# Patient Record
Sex: Female | Born: 1945 | Race: White | Hispanic: No | Marital: Married | Smoking: Never smoker
Health system: Southern US, Community
[De-identification: ages and names within clinical notes are randomized; demographics above are authoritative.]

## PROBLEM LIST (undated history)

## (undated) DIAGNOSIS — K219 Gastro-esophageal reflux disease without esophagitis: Secondary | ICD-10-CM

## (undated) DIAGNOSIS — M1990 Unspecified osteoarthritis, unspecified site: Secondary | ICD-10-CM

## (undated) DIAGNOSIS — I10 Essential (primary) hypertension: Secondary | ICD-10-CM

## (undated) DIAGNOSIS — C4490 Unspecified malignant neoplasm of skin, unspecified: Secondary | ICD-10-CM

## (undated) HISTORY — PX: TOTAL ABDOMINAL HYSTERECTOMY: SHX209

---

## 2001-03-06 ENCOUNTER — Other Ambulatory Visit: Admission: RE | Admit: 2001-03-06 | Discharge: 2001-03-06 | Payer: Self-pay | Admitting: Obstetrics and Gynecology

## 2002-03-29 ENCOUNTER — Other Ambulatory Visit: Admission: RE | Admit: 2002-03-29 | Discharge: 2002-03-29 | Payer: Self-pay | Admitting: Obstetrics and Gynecology

## 2003-10-10 ENCOUNTER — Other Ambulatory Visit: Admission: RE | Admit: 2003-10-10 | Discharge: 2003-10-10 | Payer: Self-pay | Admitting: Obstetrics and Gynecology

## 2004-03-19 ENCOUNTER — Ambulatory Visit (HOSPITAL_COMMUNITY): Admission: RE | Admit: 2004-03-19 | Discharge: 2004-03-19 | Payer: Self-pay | Admitting: Gastroenterology

## 2004-11-17 ENCOUNTER — Other Ambulatory Visit: Admission: RE | Admit: 2004-11-17 | Discharge: 2004-11-17 | Payer: Self-pay | Admitting: Obstetrics and Gynecology

## 2011-09-15 ENCOUNTER — Other Ambulatory Visit: Payer: Self-pay | Admitting: Gastroenterology

## 2012-03-12 ENCOUNTER — Other Ambulatory Visit: Payer: Self-pay | Admitting: Obstetrics and Gynecology

## 2013-06-12 DIAGNOSIS — Z0000 Encounter for general adult medical examination without abnormal findings: Secondary | ICD-10-CM | POA: Diagnosis not present

## 2013-06-12 DIAGNOSIS — Z1331 Encounter for screening for depression: Secondary | ICD-10-CM | POA: Diagnosis not present

## 2013-06-12 DIAGNOSIS — K219 Gastro-esophageal reflux disease without esophagitis: Secondary | ICD-10-CM | POA: Diagnosis not present

## 2013-06-12 DIAGNOSIS — Z23 Encounter for immunization: Secondary | ICD-10-CM | POA: Diagnosis not present

## 2013-06-12 DIAGNOSIS — R7301 Impaired fasting glucose: Secondary | ICD-10-CM | POA: Diagnosis not present

## 2013-06-12 DIAGNOSIS — E7800 Pure hypercholesterolemia, unspecified: Secondary | ICD-10-CM | POA: Diagnosis not present

## 2013-06-12 DIAGNOSIS — Z86010 Personal history of colonic polyps: Secondary | ICD-10-CM | POA: Diagnosis not present

## 2013-06-12 DIAGNOSIS — I10 Essential (primary) hypertension: Secondary | ICD-10-CM | POA: Diagnosis not present

## 2013-07-08 DIAGNOSIS — Z01419 Encounter for gynecological examination (general) (routine) without abnormal findings: Secondary | ICD-10-CM | POA: Diagnosis not present

## 2013-07-08 DIAGNOSIS — N959 Unspecified menopausal and perimenopausal disorder: Secondary | ICD-10-CM | POA: Diagnosis not present

## 2013-07-08 DIAGNOSIS — Z1231 Encounter for screening mammogram for malignant neoplasm of breast: Secondary | ICD-10-CM | POA: Diagnosis not present

## 2013-11-08 ENCOUNTER — Other Ambulatory Visit: Payer: Self-pay | Admitting: Gastroenterology

## 2013-11-08 DIAGNOSIS — Z86010 Personal history of colonic polyps: Secondary | ICD-10-CM | POA: Diagnosis not present

## 2013-11-08 DIAGNOSIS — Z09 Encounter for follow-up examination after completed treatment for conditions other than malignant neoplasm: Secondary | ICD-10-CM | POA: Diagnosis not present

## 2013-11-08 DIAGNOSIS — D126 Benign neoplasm of colon, unspecified: Secondary | ICD-10-CM | POA: Diagnosis not present

## 2014-03-19 DIAGNOSIS — H2513 Age-related nuclear cataract, bilateral: Secondary | ICD-10-CM | POA: Diagnosis not present

## 2014-06-02 DIAGNOSIS — C44519 Basal cell carcinoma of skin of other part of trunk: Secondary | ICD-10-CM | POA: Diagnosis not present

## 2014-06-02 DIAGNOSIS — D485 Neoplasm of uncertain behavior of skin: Secondary | ICD-10-CM | POA: Diagnosis not present

## 2014-06-02 DIAGNOSIS — L408 Other psoriasis: Secondary | ICD-10-CM | POA: Diagnosis not present

## 2014-07-17 DIAGNOSIS — R7301 Impaired fasting glucose: Secondary | ICD-10-CM | POA: Diagnosis not present

## 2014-07-17 DIAGNOSIS — I10 Essential (primary) hypertension: Secondary | ICD-10-CM | POA: Diagnosis not present

## 2014-07-17 DIAGNOSIS — C4491 Basal cell carcinoma of skin, unspecified: Secondary | ICD-10-CM | POA: Diagnosis not present

## 2014-07-17 DIAGNOSIS — Z86010 Personal history of colonic polyps: Secondary | ICD-10-CM | POA: Diagnosis not present

## 2014-07-17 DIAGNOSIS — Z23 Encounter for immunization: Secondary | ICD-10-CM | POA: Diagnosis not present

## 2014-07-17 DIAGNOSIS — Z0000 Encounter for general adult medical examination without abnormal findings: Secondary | ICD-10-CM | POA: Diagnosis not present

## 2014-07-17 DIAGNOSIS — E785 Hyperlipidemia, unspecified: Secondary | ICD-10-CM | POA: Diagnosis not present

## 2014-07-17 DIAGNOSIS — R8290 Unspecified abnormal findings in urine: Secondary | ICD-10-CM | POA: Diagnosis not present

## 2014-08-04 DIAGNOSIS — L570 Actinic keratosis: Secondary | ICD-10-CM | POA: Diagnosis not present

## 2014-08-04 DIAGNOSIS — L408 Other psoriasis: Secondary | ICD-10-CM | POA: Diagnosis not present

## 2014-09-10 ENCOUNTER — Other Ambulatory Visit: Payer: Self-pay | Admitting: Obstetrics and Gynecology

## 2014-09-10 DIAGNOSIS — Z1231 Encounter for screening mammogram for malignant neoplasm of breast: Secondary | ICD-10-CM | POA: Diagnosis not present

## 2014-09-10 DIAGNOSIS — Z124 Encounter for screening for malignant neoplasm of cervix: Secondary | ICD-10-CM | POA: Diagnosis not present

## 2014-09-10 DIAGNOSIS — Z1272 Encounter for screening for malignant neoplasm of vagina: Secondary | ICD-10-CM | POA: Diagnosis not present

## 2014-09-10 DIAGNOSIS — Z90710 Acquired absence of both cervix and uterus: Secondary | ICD-10-CM | POA: Diagnosis not present

## 2014-09-10 DIAGNOSIS — Z6836 Body mass index (BMI) 36.0-36.9, adult: Secondary | ICD-10-CM | POA: Diagnosis not present

## 2014-09-11 LAB — CYTOLOGY - PAP

## 2015-01-14 DIAGNOSIS — H1011 Acute atopic conjunctivitis, right eye: Secondary | ICD-10-CM | POA: Diagnosis not present

## 2015-04-08 DIAGNOSIS — D2239 Melanocytic nevi of other parts of face: Secondary | ICD-10-CM | POA: Diagnosis not present

## 2015-04-08 DIAGNOSIS — L408 Other psoriasis: Secondary | ICD-10-CM | POA: Diagnosis not present

## 2015-04-08 DIAGNOSIS — N760 Acute vaginitis: Secondary | ICD-10-CM | POA: Diagnosis not present

## 2015-04-08 DIAGNOSIS — Z85828 Personal history of other malignant neoplasm of skin: Secondary | ICD-10-CM | POA: Diagnosis not present

## 2015-04-08 DIAGNOSIS — Z08 Encounter for follow-up examination after completed treatment for malignant neoplasm: Secondary | ICD-10-CM | POA: Diagnosis not present

## 2015-04-08 DIAGNOSIS — L820 Inflamed seborrheic keratosis: Secondary | ICD-10-CM | POA: Diagnosis not present

## 2015-06-22 DIAGNOSIS — H35363 Drusen (degenerative) of macula, bilateral: Secondary | ICD-10-CM | POA: Diagnosis not present

## 2015-06-22 DIAGNOSIS — H5203 Hypermetropia, bilateral: Secondary | ICD-10-CM | POA: Diagnosis not present

## 2015-06-22 DIAGNOSIS — Z135 Encounter for screening for eye and ear disorders: Secondary | ICD-10-CM | POA: Diagnosis not present

## 2015-06-22 DIAGNOSIS — H04123 Dry eye syndrome of bilateral lacrimal glands: Secondary | ICD-10-CM | POA: Diagnosis not present

## 2015-06-22 DIAGNOSIS — H2513 Age-related nuclear cataract, bilateral: Secondary | ICD-10-CM | POA: Diagnosis not present

## 2015-08-05 DIAGNOSIS — E785 Hyperlipidemia, unspecified: Secondary | ICD-10-CM | POA: Diagnosis not present

## 2015-08-05 DIAGNOSIS — Z86010 Personal history of colonic polyps: Secondary | ICD-10-CM | POA: Diagnosis not present

## 2015-08-05 DIAGNOSIS — Z0000 Encounter for general adult medical examination without abnormal findings: Secondary | ICD-10-CM | POA: Diagnosis not present

## 2015-08-05 DIAGNOSIS — R7301 Impaired fasting glucose: Secondary | ICD-10-CM | POA: Diagnosis not present

## 2015-08-05 DIAGNOSIS — I10 Essential (primary) hypertension: Secondary | ICD-10-CM | POA: Diagnosis not present

## 2015-08-05 DIAGNOSIS — L309 Dermatitis, unspecified: Secondary | ICD-10-CM | POA: Diagnosis not present

## 2015-09-21 DIAGNOSIS — B078 Other viral warts: Secondary | ICD-10-CM | POA: Diagnosis not present

## 2015-09-21 DIAGNOSIS — D2371 Other benign neoplasm of skin of right lower limb, including hip: Secondary | ICD-10-CM | POA: Diagnosis not present

## 2015-09-21 DIAGNOSIS — L821 Other seborrheic keratosis: Secondary | ICD-10-CM | POA: Diagnosis not present

## 2015-09-21 DIAGNOSIS — D485 Neoplasm of uncertain behavior of skin: Secondary | ICD-10-CM | POA: Diagnosis not present

## 2015-11-09 DIAGNOSIS — L700 Acne vulgaris: Secondary | ICD-10-CM | POA: Diagnosis not present

## 2015-11-09 DIAGNOSIS — L2084 Intrinsic (allergic) eczema: Secondary | ICD-10-CM | POA: Diagnosis not present

## 2015-11-11 DIAGNOSIS — N958 Other specified menopausal and perimenopausal disorders: Secondary | ICD-10-CM | POA: Diagnosis not present

## 2015-11-11 DIAGNOSIS — Z1231 Encounter for screening mammogram for malignant neoplasm of breast: Secondary | ICD-10-CM | POA: Diagnosis not present

## 2015-11-18 ENCOUNTER — Other Ambulatory Visit: Payer: Self-pay | Admitting: Obstetrics and Gynecology

## 2015-11-18 DIAGNOSIS — R928 Other abnormal and inconclusive findings on diagnostic imaging of breast: Secondary | ICD-10-CM

## 2015-11-20 ENCOUNTER — Ambulatory Visit
Admission: RE | Admit: 2015-11-20 | Discharge: 2015-11-20 | Disposition: A | Discharge status: Home / Self Care | Payer: Medicare Other | Source: Ambulatory Visit | Attending: Obstetrics and Gynecology | Admitting: Obstetrics and Gynecology

## 2015-11-20 ENCOUNTER — Other Ambulatory Visit: Payer: Self-pay | Admitting: Obstetrics and Gynecology

## 2015-11-20 DIAGNOSIS — R928 Other abnormal and inconclusive findings on diagnostic imaging of breast: Secondary | ICD-10-CM

## 2015-11-20 DIAGNOSIS — N63 Unspecified lump in breast: Secondary | ICD-10-CM | POA: Diagnosis not present

## 2016-01-05 DIAGNOSIS — Z6834 Body mass index (BMI) 34.0-34.9, adult: Secondary | ICD-10-CM | POA: Diagnosis not present

## 2016-01-05 DIAGNOSIS — Z01419 Encounter for gynecological examination (general) (routine) without abnormal findings: Secondary | ICD-10-CM | POA: Diagnosis not present

## 2016-03-10 DIAGNOSIS — H8110 Benign paroxysmal vertigo, unspecified ear: Secondary | ICD-10-CM | POA: Diagnosis not present

## 2016-05-09 DIAGNOSIS — D485 Neoplasm of uncertain behavior of skin: Secondary | ICD-10-CM | POA: Diagnosis not present

## 2016-05-09 DIAGNOSIS — L821 Other seborrheic keratosis: Secondary | ICD-10-CM | POA: Diagnosis not present

## 2016-05-09 DIAGNOSIS — L820 Inflamed seborrheic keratosis: Secondary | ICD-10-CM | POA: Diagnosis not present

## 2016-05-09 DIAGNOSIS — D225 Melanocytic nevi of trunk: Secondary | ICD-10-CM | POA: Diagnosis not present

## 2016-06-28 ENCOUNTER — Other Ambulatory Visit: Payer: Self-pay | Admitting: Obstetrics and Gynecology

## 2016-06-28 DIAGNOSIS — N6489 Other specified disorders of breast: Secondary | ICD-10-CM

## 2016-06-28 DIAGNOSIS — N63 Unspecified lump in unspecified breast: Secondary | ICD-10-CM

## 2016-07-12 ENCOUNTER — Other Ambulatory Visit: Payer: Self-pay | Admitting: Obstetrics and Gynecology

## 2016-07-12 ENCOUNTER — Ambulatory Visit
Admission: RE | Admit: 2016-07-12 | Discharge: 2016-07-12 | Disposition: A | Discharge status: Home / Self Care | Payer: Medicare Other | Source: Ambulatory Visit | Attending: Obstetrics and Gynecology | Admitting: Obstetrics and Gynecology

## 2016-07-12 DIAGNOSIS — N6012 Diffuse cystic mastopathy of left breast: Secondary | ICD-10-CM | POA: Diagnosis not present

## 2016-07-12 DIAGNOSIS — H43811 Vitreous degeneration, right eye: Secondary | ICD-10-CM | POA: Diagnosis not present

## 2016-07-12 DIAGNOSIS — R928 Other abnormal and inconclusive findings on diagnostic imaging of breast: Secondary | ICD-10-CM | POA: Diagnosis not present

## 2016-07-12 DIAGNOSIS — N6489 Other specified disorders of breast: Secondary | ICD-10-CM

## 2016-07-12 DIAGNOSIS — H2513 Age-related nuclear cataract, bilateral: Secondary | ICD-10-CM | POA: Diagnosis not present

## 2016-07-12 DIAGNOSIS — H35363 Drusen (degenerative) of macula, bilateral: Secondary | ICD-10-CM | POA: Diagnosis not present

## 2016-07-12 HISTORY — DX: Unspecified malignant neoplasm of skin, unspecified: C44.90

## 2016-07-14 ENCOUNTER — Other Ambulatory Visit: Payer: Self-pay | Admitting: Obstetrics and Gynecology

## 2016-07-14 DIAGNOSIS — N63 Unspecified lump in unspecified breast: Secondary | ICD-10-CM

## 2016-08-03 DIAGNOSIS — H43811 Vitreous degeneration, right eye: Secondary | ICD-10-CM | POA: Diagnosis not present

## 2016-08-03 DIAGNOSIS — H35363 Drusen (degenerative) of macula, bilateral: Secondary | ICD-10-CM | POA: Diagnosis not present

## 2016-08-03 DIAGNOSIS — H2513 Age-related nuclear cataract, bilateral: Secondary | ICD-10-CM | POA: Diagnosis not present

## 2016-08-17 DIAGNOSIS — E785 Hyperlipidemia, unspecified: Secondary | ICD-10-CM | POA: Diagnosis not present

## 2016-08-17 DIAGNOSIS — M899 Disorder of bone, unspecified: Secondary | ICD-10-CM | POA: Diagnosis not present

## 2016-08-17 DIAGNOSIS — Z0000 Encounter for general adult medical examination without abnormal findings: Secondary | ICD-10-CM | POA: Diagnosis not present

## 2016-08-17 DIAGNOSIS — I10 Essential (primary) hypertension: Secondary | ICD-10-CM | POA: Diagnosis not present

## 2016-08-17 DIAGNOSIS — R7301 Impaired fasting glucose: Secondary | ICD-10-CM | POA: Diagnosis not present

## 2016-08-17 DIAGNOSIS — M85859 Other specified disorders of bone density and structure, unspecified thigh: Secondary | ICD-10-CM | POA: Diagnosis not present

## 2016-08-17 DIAGNOSIS — L309 Dermatitis, unspecified: Secondary | ICD-10-CM | POA: Diagnosis not present

## 2016-08-17 DIAGNOSIS — Z86010 Personal history of colonic polyps: Secondary | ICD-10-CM | POA: Diagnosis not present

## 2016-09-14 DIAGNOSIS — H5202 Hypermetropia, left eye: Secondary | ICD-10-CM | POA: Diagnosis not present

## 2016-09-14 DIAGNOSIS — H35363 Drusen (degenerative) of macula, bilateral: Secondary | ICD-10-CM | POA: Diagnosis not present

## 2016-11-24 ENCOUNTER — Other Ambulatory Visit: Payer: Medicare Other

## 2016-11-30 DIAGNOSIS — L570 Actinic keratosis: Secondary | ICD-10-CM | POA: Diagnosis not present

## 2016-11-30 DIAGNOSIS — Z85828 Personal history of other malignant neoplasm of skin: Secondary | ICD-10-CM | POA: Diagnosis not present

## 2016-11-30 DIAGNOSIS — D225 Melanocytic nevi of trunk: Secondary | ICD-10-CM | POA: Diagnosis not present

## 2016-11-30 DIAGNOSIS — Z08 Encounter for follow-up examination after completed treatment for malignant neoplasm: Secondary | ICD-10-CM | POA: Diagnosis not present

## 2017-01-12 ENCOUNTER — Ambulatory Visit
Admission: RE | Admit: 2017-01-12 | Discharge: 2017-01-12 | Disposition: A | Discharge status: Home / Self Care | Payer: Medicare Other | Source: Ambulatory Visit | Attending: Obstetrics and Gynecology | Admitting: Obstetrics and Gynecology

## 2017-01-12 DIAGNOSIS — Z6836 Body mass index (BMI) 36.0-36.9, adult: Secondary | ICD-10-CM | POA: Diagnosis not present

## 2017-01-12 DIAGNOSIS — Z90710 Acquired absence of both cervix and uterus: Secondary | ICD-10-CM | POA: Diagnosis not present

## 2017-01-12 DIAGNOSIS — R928 Other abnormal and inconclusive findings on diagnostic imaging of breast: Secondary | ICD-10-CM | POA: Diagnosis not present

## 2017-01-12 DIAGNOSIS — Z1272 Encounter for screening for malignant neoplasm of vagina: Secondary | ICD-10-CM | POA: Diagnosis not present

## 2017-01-12 DIAGNOSIS — N6489 Other specified disorders of breast: Secondary | ICD-10-CM | POA: Diagnosis not present

## 2017-01-12 DIAGNOSIS — N63 Unspecified lump in unspecified breast: Secondary | ICD-10-CM

## 2017-01-12 DIAGNOSIS — Z124 Encounter for screening for malignant neoplasm of cervix: Secondary | ICD-10-CM | POA: Diagnosis not present

## 2017-01-26 DIAGNOSIS — M25512 Pain in left shoulder: Secondary | ICD-10-CM | POA: Diagnosis not present

## 2017-01-26 DIAGNOSIS — M25571 Pain in right ankle and joints of right foot: Secondary | ICD-10-CM | POA: Diagnosis not present

## 2017-01-31 DIAGNOSIS — M25571 Pain in right ankle and joints of right foot: Secondary | ICD-10-CM | POA: Diagnosis not present

## 2017-01-31 DIAGNOSIS — M7661 Achilles tendinitis, right leg: Secondary | ICD-10-CM | POA: Diagnosis not present

## 2017-02-10 DIAGNOSIS — M25571 Pain in right ankle and joints of right foot: Secondary | ICD-10-CM | POA: Diagnosis not present

## 2017-02-10 DIAGNOSIS — M7661 Achilles tendinitis, right leg: Secondary | ICD-10-CM | POA: Diagnosis not present

## 2017-02-14 DIAGNOSIS — M25571 Pain in right ankle and joints of right foot: Secondary | ICD-10-CM | POA: Diagnosis not present

## 2017-02-14 DIAGNOSIS — M7661 Achilles tendinitis, right leg: Secondary | ICD-10-CM | POA: Diagnosis not present

## 2017-02-16 DIAGNOSIS — M25571 Pain in right ankle and joints of right foot: Secondary | ICD-10-CM | POA: Diagnosis not present

## 2017-02-23 DIAGNOSIS — M25571 Pain in right ankle and joints of right foot: Secondary | ICD-10-CM | POA: Diagnosis not present

## 2017-03-07 DIAGNOSIS — M25571 Pain in right ankle and joints of right foot: Secondary | ICD-10-CM | POA: Diagnosis not present

## 2017-09-27 DIAGNOSIS — Z6835 Body mass index (BMI) 35.0-35.9, adult: Secondary | ICD-10-CM | POA: Diagnosis not present

## 2017-09-27 DIAGNOSIS — Z0000 Encounter for general adult medical examination without abnormal findings: Secondary | ICD-10-CM | POA: Diagnosis not present

## 2017-09-27 DIAGNOSIS — L309 Dermatitis, unspecified: Secondary | ICD-10-CM | POA: Diagnosis not present

## 2017-09-27 DIAGNOSIS — I10 Essential (primary) hypertension: Secondary | ICD-10-CM | POA: Diagnosis not present

## 2017-09-27 DIAGNOSIS — R7301 Impaired fasting glucose: Secondary | ICD-10-CM | POA: Diagnosis not present

## 2017-09-27 DIAGNOSIS — E785 Hyperlipidemia, unspecified: Secondary | ICD-10-CM | POA: Diagnosis not present

## 2017-09-27 DIAGNOSIS — Z86010 Personal history of colonic polyps: Secondary | ICD-10-CM | POA: Diagnosis not present

## 2017-12-21 ENCOUNTER — Other Ambulatory Visit: Payer: Self-pay | Admitting: Obstetrics and Gynecology

## 2017-12-21 DIAGNOSIS — N6001 Solitary cyst of right breast: Secondary | ICD-10-CM

## 2018-01-08 DIAGNOSIS — H5202 Hypermetropia, left eye: Secondary | ICD-10-CM | POA: Diagnosis not present

## 2018-01-08 DIAGNOSIS — H43811 Vitreous degeneration, right eye: Secondary | ICD-10-CM | POA: Diagnosis not present

## 2018-01-08 DIAGNOSIS — Z135 Encounter for screening for eye and ear disorders: Secondary | ICD-10-CM | POA: Diagnosis not present

## 2018-01-08 DIAGNOSIS — H35363 Drusen (degenerative) of macula, bilateral: Secondary | ICD-10-CM | POA: Diagnosis not present

## 2018-01-08 DIAGNOSIS — H2513 Age-related nuclear cataract, bilateral: Secondary | ICD-10-CM | POA: Diagnosis not present

## 2018-01-15 ENCOUNTER — Ambulatory Visit
Admission: RE | Admit: 2018-01-15 | Discharge: 2018-01-15 | Disposition: A | Discharge status: Home / Self Care | Payer: Medicare Other | Source: Ambulatory Visit | Attending: Obstetrics and Gynecology | Admitting: Obstetrics and Gynecology

## 2018-01-15 DIAGNOSIS — N6011 Diffuse cystic mastopathy of right breast: Secondary | ICD-10-CM | POA: Diagnosis not present

## 2018-01-15 DIAGNOSIS — N6001 Solitary cyst of right breast: Secondary | ICD-10-CM

## 2018-01-15 DIAGNOSIS — R928 Other abnormal and inconclusive findings on diagnostic imaging of breast: Secondary | ICD-10-CM | POA: Diagnosis not present

## 2018-01-17 DIAGNOSIS — N958 Other specified menopausal and perimenopausal disorders: Secondary | ICD-10-CM | POA: Diagnosis not present

## 2018-01-17 DIAGNOSIS — Z6837 Body mass index (BMI) 37.0-37.9, adult: Secondary | ICD-10-CM | POA: Diagnosis not present

## 2018-01-17 DIAGNOSIS — Z01419 Encounter for gynecological examination (general) (routine) without abnormal findings: Secondary | ICD-10-CM | POA: Diagnosis not present

## 2018-01-23 DIAGNOSIS — H10239 Serous conjunctivitis, except viral, unspecified eye: Secondary | ICD-10-CM | POA: Diagnosis not present

## 2018-03-09 DIAGNOSIS — B029 Zoster without complications: Secondary | ICD-10-CM | POA: Diagnosis not present

## 2018-03-20 DIAGNOSIS — C44311 Basal cell carcinoma of skin of nose: Secondary | ICD-10-CM | POA: Diagnosis not present

## 2018-03-20 DIAGNOSIS — R208 Other disturbances of skin sensation: Secondary | ICD-10-CM | POA: Diagnosis not present

## 2018-03-20 DIAGNOSIS — L814 Other melanin hyperpigmentation: Secondary | ICD-10-CM | POA: Diagnosis not present

## 2018-03-20 DIAGNOSIS — L821 Other seborrheic keratosis: Secondary | ICD-10-CM | POA: Diagnosis not present

## 2018-03-20 DIAGNOSIS — C44519 Basal cell carcinoma of skin of other part of trunk: Secondary | ICD-10-CM | POA: Diagnosis not present

## 2018-03-20 DIAGNOSIS — C44319 Basal cell carcinoma of skin of other parts of face: Secondary | ICD-10-CM | POA: Diagnosis not present

## 2018-03-20 DIAGNOSIS — L218 Other seborrheic dermatitis: Secondary | ICD-10-CM | POA: Diagnosis not present

## 2018-03-20 DIAGNOSIS — L308 Other specified dermatitis: Secondary | ICD-10-CM | POA: Diagnosis not present

## 2018-03-20 DIAGNOSIS — D2261 Melanocytic nevi of right upper limb, including shoulder: Secondary | ICD-10-CM | POA: Diagnosis not present

## 2018-03-20 DIAGNOSIS — D2262 Melanocytic nevi of left upper limb, including shoulder: Secondary | ICD-10-CM | POA: Diagnosis not present

## 2018-03-20 DIAGNOSIS — D2271 Melanocytic nevi of right lower limb, including hip: Secondary | ICD-10-CM | POA: Diagnosis not present

## 2018-03-20 DIAGNOSIS — Z85828 Personal history of other malignant neoplasm of skin: Secondary | ICD-10-CM | POA: Diagnosis not present

## 2018-03-20 DIAGNOSIS — L570 Actinic keratosis: Secondary | ICD-10-CM | POA: Diagnosis not present

## 2018-04-17 DIAGNOSIS — Z85828 Personal history of other malignant neoplasm of skin: Secondary | ICD-10-CM | POA: Diagnosis not present

## 2018-04-17 DIAGNOSIS — C44311 Basal cell carcinoma of skin of nose: Secondary | ICD-10-CM | POA: Diagnosis not present

## 2018-05-03 DIAGNOSIS — M5416 Radiculopathy, lumbar region: Secondary | ICD-10-CM | POA: Diagnosis not present

## 2018-05-03 DIAGNOSIS — Z6837 Body mass index (BMI) 37.0-37.9, adult: Secondary | ICD-10-CM | POA: Diagnosis not present

## 2018-05-18 ENCOUNTER — Other Ambulatory Visit: Payer: Self-pay | Admitting: Family Medicine

## 2018-05-18 DIAGNOSIS — M5416 Radiculopathy, lumbar region: Secondary | ICD-10-CM

## 2018-05-22 ENCOUNTER — Other Ambulatory Visit: Payer: Medicare Other

## 2018-05-25 ENCOUNTER — Other Ambulatory Visit: Payer: Medicare Other

## 2018-07-16 ENCOUNTER — Other Ambulatory Visit: Payer: Medicare Other

## 2018-07-30 ENCOUNTER — Ambulatory Visit
Admission: RE | Admit: 2018-07-30 | Discharge: 2018-07-30 | Disposition: A | Discharge status: Home / Self Care | Payer: Medicare Other | Source: Ambulatory Visit | Attending: Family Medicine | Admitting: Family Medicine

## 2018-07-30 ENCOUNTER — Other Ambulatory Visit: Payer: Self-pay

## 2018-07-30 DIAGNOSIS — M48061 Spinal stenosis, lumbar region without neurogenic claudication: Secondary | ICD-10-CM | POA: Diagnosis not present

## 2018-07-30 DIAGNOSIS — M5416 Radiculopathy, lumbar region: Secondary | ICD-10-CM

## 2018-08-17 DIAGNOSIS — M5416 Radiculopathy, lumbar region: Secondary | ICD-10-CM | POA: Diagnosis not present

## 2018-08-17 DIAGNOSIS — R200 Anesthesia of skin: Secondary | ICD-10-CM | POA: Diagnosis not present

## 2018-08-17 DIAGNOSIS — Z6836 Body mass index (BMI) 36.0-36.9, adult: Secondary | ICD-10-CM | POA: Diagnosis not present

## 2018-08-17 DIAGNOSIS — I10 Essential (primary) hypertension: Secondary | ICD-10-CM | POA: Diagnosis not present

## 2018-08-24 ENCOUNTER — Other Ambulatory Visit: Payer: Self-pay | Admitting: Neurosurgery

## 2018-08-24 DIAGNOSIS — M5416 Radiculopathy, lumbar region: Secondary | ICD-10-CM

## 2018-09-17 ENCOUNTER — Ambulatory Visit
Admission: RE | Admit: 2018-09-17 | Discharge: 2018-09-17 | Disposition: A | Discharge status: Home / Self Care | Payer: Medicare Other | Source: Ambulatory Visit | Attending: Neurosurgery | Admitting: Neurosurgery

## 2018-09-17 DIAGNOSIS — M5416 Radiculopathy, lumbar region: Secondary | ICD-10-CM

## 2018-09-17 DIAGNOSIS — M48061 Spinal stenosis, lumbar region without neurogenic claudication: Secondary | ICD-10-CM | POA: Diagnosis not present

## 2018-09-17 MED ORDER — IOPAMIDOL (ISOVUE-M 200) INJECTION 41%
20.0000 mL | Freq: Once | INTRAMUSCULAR | Status: AC
  Administered 2018-09-17: 12:00:00 20 mL via INTRATHECAL

## 2018-09-17 MED ORDER — DIAZEPAM 5 MG PO TABS
5.0000 mg | ORAL_TABLET | Freq: Once | ORAL | Status: AC
  Administered 2018-09-17: 10:00:00 5 mg via ORAL

## 2018-09-17 NOTE — Discharge Instructions (Signed)

## 2018-10-05 DIAGNOSIS — I10 Essential (primary) hypertension: Secondary | ICD-10-CM | POA: Diagnosis not present

## 2018-10-05 DIAGNOSIS — M48062 Spinal stenosis, lumbar region with neurogenic claudication: Secondary | ICD-10-CM | POA: Diagnosis not present

## 2018-10-05 DIAGNOSIS — Z6836 Body mass index (BMI) 36.0-36.9, adult: Secondary | ICD-10-CM | POA: Diagnosis not present

## 2018-10-31 DIAGNOSIS — I10 Essential (primary) hypertension: Secondary | ICD-10-CM | POA: Diagnosis not present

## 2018-10-31 DIAGNOSIS — Z23 Encounter for immunization: Secondary | ICD-10-CM | POA: Diagnosis not present

## 2018-10-31 DIAGNOSIS — S81812A Laceration without foreign body, left lower leg, initial encounter: Secondary | ICD-10-CM | POA: Diagnosis not present

## 2018-10-31 DIAGNOSIS — Z1159 Encounter for screening for other viral diseases: Secondary | ICD-10-CM | POA: Diagnosis not present

## 2018-10-31 DIAGNOSIS — Z86010 Personal history of colonic polyps: Secondary | ICD-10-CM | POA: Diagnosis not present

## 2018-10-31 DIAGNOSIS — M48062 Spinal stenosis, lumbar region with neurogenic claudication: Secondary | ICD-10-CM | POA: Diagnosis not present

## 2018-10-31 DIAGNOSIS — Z0000 Encounter for general adult medical examination without abnormal findings: Secondary | ICD-10-CM | POA: Diagnosis not present

## 2018-10-31 DIAGNOSIS — R7301 Impaired fasting glucose: Secondary | ICD-10-CM | POA: Diagnosis not present

## 2018-10-31 DIAGNOSIS — M85859 Other specified disorders of bone density and structure, unspecified thigh: Secondary | ICD-10-CM | POA: Diagnosis not present

## 2018-10-31 DIAGNOSIS — E785 Hyperlipidemia, unspecified: Secondary | ICD-10-CM | POA: Diagnosis not present

## 2018-12-19 DIAGNOSIS — J0110 Acute frontal sinusitis, unspecified: Secondary | ICD-10-CM | POA: Diagnosis not present

## 2019-01-18 DIAGNOSIS — I10 Essential (primary) hypertension: Secondary | ICD-10-CM | POA: Insufficient documentation

## 2019-01-18 DIAGNOSIS — M48062 Spinal stenosis, lumbar region with neurogenic claudication: Secondary | ICD-10-CM | POA: Diagnosis not present

## 2019-01-21 ENCOUNTER — Other Ambulatory Visit: Payer: Self-pay | Admitting: Neurosurgery

## 2019-03-06 NOTE — Progress Notes (Signed)
[id number] - [place], [place] [address] Phone: [phone] Fax: [phone]      Your procedure is scheduled on January 11  Report to [facility] Main Entrance "A" at 0530 A.M., and check in at the Admitting office.  Call this number if you have problems the morning of surgery:  [phone]  Call [phone] if you have any questions prior to your surgery date Monday-Friday 8am-4pm    Remember:  Do not eat or drink after midnight the night before your surgery    Take these medicines the morning of surgery with A SIP OF WATER hydrOXYzine (ATARAX/VISTARIL)   7 days prior to surgery STOP taking any Aspirin (unless otherwise instructed by your surgeon), Aleve, Naproxen, Ibuprofen, Motrin, Advil, Goody's, BC's, all herbal medications, fish oil, and all vitamins.    The Morning of Surgery  Do not wear jewelry, make-up or nail polish.  Do not wear lotions, powders, or perfumes, or deodorant  Do not shave 48 hours prior to surgery.    Do not bring valuables to the hospital.  [facility] is not responsible for any belongings or valuables.  If you are a smoker, DO NOT Smoke 24 hours prior to surgery  If you wear a CPAP at night please bring your mask, tubing, and machine the morning of surgery   Remember that you must have someone to transport you home after your surgery, and remain with you for 24 hours if you are discharged the same day.   Please bring cases for contacts, glasses, hearing aids, dentures or bridgework because it cannot be worn into surgery.    Leave your suitcase in the car.  After surgery it may be brought to your room.  For patients admitted to the hospital, discharge time will be determined by your treatment team.  Patients discharged the day of surgery will not be allowed to drive home.    Special instructions:   [facility]- Preparing For Surgery  Before surgery, you can play an  important role. Because skin is not sterile, your skin needs to be as free of germs as possible. You can reduce the number of germs on your skin by washing with CHG (chlorahexidine gluconate) Soap before surgery.  CHG is an antiseptic cleaner which kills germs and bonds with the skin to continue killing germs even after washing.    Oral Hygiene is also important to reduce your risk of infection.  Remember - BRUSH YOUR TEETH THE MORNING OF SURGERY WITH YOUR REGULAR TOOTHPASTE  Please do not use if you have an allergy to CHG or antibacterial soaps. If your skin becomes reddened/irritated stop using the CHG.  Do not shave (including legs and underarms) for at least 48 hours prior to first CHG shower. It is OK to shave your face.  Please follow these instructions carefully.   1. Shower the NIGHT BEFORE SURGERY and the MORNING OF SURGERY with CHG Soap.   2. If you chose to wash your hair, wash your hair first as usual with your normal shampoo.  3. After you shampoo, rinse your hair and body thoroughly to remove the shampoo.  4. Use CHG as you would any other liquid soap. You can apply CHG directly to the skin and wash gently with a scrungie or a clean washcloth.   5. Apply the CHG Soap to your body ONLY FROM THE NECK DOWN.  Do not use on open wounds or open sores.  Avoid contact with your eyes, ears, mouth and genitals (private parts). Wash Face and genitals (private parts)  with your normal soap.   6. Wash thoroughly, paying special attention to the area where your surgery will be performed.  7. Thoroughly rinse your body with warm water from the neck down.  8. DO NOT shower/wash with your normal soap after using and rinsing off the CHG Soap.  9. Pat yourself dry with a CLEAN TOWEL.  10. Wear CLEAN PAJAMAS to bed the night before surgery, wear comfortable clothes the morning of surgery  11. Place CLEAN SHEETS on your bed the night of your first shower and DO NOT SLEEP WITH PETS.    Day of  Surgery:  Please shower the morning of surgery with the CHG soap Do not apply any deodorants/lotions. Please wear clean clothes to the hospital/surgery center.   Remember to brush your teeth WITH YOUR REGULAR TOOTHPASTE.   Please read over the following fact sheets that you were given.

## 2019-03-07 ENCOUNTER — Other Ambulatory Visit (HOSPITAL_COMMUNITY)
Admission: RE | Admit: 2019-03-07 | Discharge: 2019-03-07 | Disposition: A | Discharge status: Home / Self Care | Payer: Medicare Other | Source: Ambulatory Visit | Attending: Neurosurgery | Admitting: Neurosurgery

## 2019-03-07 ENCOUNTER — Encounter (HOSPITAL_COMMUNITY)
Admission: RE | Admit: 2019-03-07 | Discharge: 2019-03-07 | Disposition: A | Discharge status: Home / Self Care | Payer: Medicare Other | Source: Ambulatory Visit | Attending: Neurosurgery | Admitting: Neurosurgery

## 2019-03-07 ENCOUNTER — Encounter (HOSPITAL_COMMUNITY): Payer: Self-pay

## 2019-03-07 ENCOUNTER — Other Ambulatory Visit: Payer: Self-pay

## 2019-03-07 DIAGNOSIS — Z20822 Contact with and (suspected) exposure to covid-19: Secondary | ICD-10-CM | POA: Diagnosis not present

## 2019-03-07 DIAGNOSIS — Z01818 Encounter for other preprocedural examination: Secondary | ICD-10-CM | POA: Insufficient documentation

## 2019-03-07 HISTORY — DX: Essential (primary) hypertension: I10

## 2019-03-07 HISTORY — DX: Gastro-esophageal reflux disease without esophagitis: K21.9

## 2019-03-07 HISTORY — DX: Unspecified osteoarthritis, unspecified site: M19.90

## 2019-03-07 LAB — BASIC METABOLIC PANEL
Anion gap: 8 (ref 5–15)
BUN: 19 mg/dL (ref 8–23)
CO2: 26 mmol/L (ref 22–32)
Calcium: 9.2 mg/dL (ref 8.9–10.3)
Chloride: 109 mmol/L (ref 98–111)
Creatinine, Ser: 0.87 mg/dL (ref 0.44–1.00)
GFR calc Af Amer: >60 mL/min (ref >60)
GFR calc non Af Amer: >60 mL/min (ref >60)
Glucose, Bld: 126 mg/dL — ABNORMAL HIGH (ref 70–99)
Potassium: 3.9 mmol/L (ref 3.5–5.1)
Sodium: 143 mmol/L (ref 135–145)

## 2019-03-07 LAB — CBC
HCT: 46.1 % — ABNORMAL HIGH (ref 36.0–46.0)
Hemoglobin: 15.4 g/dL — ABNORMAL HIGH (ref 12.0–15.0)
MCH: 29.2 pg (ref 26.0–34.0)
MCHC: 33.4 g/dL (ref 30.0–36.0)
MCV: 87.3 fL (ref 80.0–100.0)
Platelets: 308 10*3/uL (ref 150–400)
RBC: 5.28 MIL/uL — ABNORMAL HIGH (ref 3.87–5.11)
RDW: 12 % (ref 11.5–15.5)
WBC: 5.6 10*3/uL (ref 4.0–10.5)
nRBC: 0 % (ref 0.0–0.2)

## 2019-03-07 LAB — SURGICAL PCR SCREEN
MRSA, PCR: NEGATIVE
Staphylococcus aureus: NEGATIVE

## 2019-03-07 NOTE — Progress Notes (Signed)
PCP - Hulan Fess Cardiologist - denies  Chest x-ray - n/a EKG - 03-07-19   COVID TEST- Thursday (03-07-19)  Possibility that patient's COVID test could come back positive.  Patient's husband tested positive for COVID at the end of October.  Patient stated that she, herself, never had symptoms, therefore she never got tested. She could of potentially had COVID at the same time as her husband.  Advised patient that there is a potential that her surgery could get postponed due to a positive test.      Anesthesia review: n/a  Patient denies shortness of breath, fever, cough and chest pain at PAT appointment   All instructions explained to the patient, with a verbal understanding of the material. Patient agrees to go over the instructions while at home for a better understanding. Patient also instructed to self quarantine after being tested for COVID-19. The opportunity to ask questions was provided.

## 2019-03-08 DIAGNOSIS — H25813 Combined forms of age-related cataract, bilateral: Secondary | ICD-10-CM | POA: Diagnosis not present

## 2019-03-08 DIAGNOSIS — H35363 Drusen (degenerative) of macula, bilateral: Secondary | ICD-10-CM | POA: Diagnosis not present

## 2019-03-08 DIAGNOSIS — H5202 Hypermetropia, left eye: Secondary | ICD-10-CM | POA: Diagnosis not present

## 2019-03-08 DIAGNOSIS — H43813 Vitreous degeneration, bilateral: Secondary | ICD-10-CM | POA: Diagnosis not present

## 2019-03-09 LAB — NOVEL CORONAVIRUS, NAA (HOSP ORDER, SEND-OUT TO REF LAB; TAT 18-24 HRS): SARS-CoV-2, NAA: NOT DETECTED

## 2019-03-10 NOTE — Anesthesia Preprocedure Evaluation (Addendum)
Anesthesia Evaluation  Patient identified by MRN, date of birth, ID band Patient awake    Reviewed: Allergy & Precautions, NPO status , Patient's Chart, lab work & pertinent test results  Airway Mallampati: III  TM Distance: >3 FB Neck ROM: Full    Dental no notable dental hx.    Pulmonary neg pulmonary ROS,    Pulmonary exam normal breath sounds clear to auscultation       Cardiovascular hypertension, Pt. on medications Normal cardiovascular exam Rhythm:Regular Rate:Normal  ECG: SR, rate 76   Neuro/Psych negative neurological ROS  negative psych ROS   GI/Hepatic Neg liver ROS, GERD  Controlled,  Endo/Other  negative endocrine ROS  Renal/GU negative Renal ROS     Musculoskeletal negative musculoskeletal ROS (+)   Abdominal (+) + obese,   Peds  Hematology negative hematology ROS (+)   Anesthesia Other Findings SPINAL STENOSIS, LUMBAR REGION WITH NEUROGENIC CLAUDICATION  Reproductive/Obstetrics                            Anesthesia Physical Anesthesia Plan  ASA: II  Anesthesia Plan: General   Post-op Pain Management:    Induction: Intravenous  PONV Risk Score and Plan: 4 or greater and Ondansetron, Dexamethasone, Midazolam and Treatment may vary due to age or medical condition  Airway Management Planned: Oral ETT  Additional Equipment:   Intra-op Plan:   Post-operative Plan: Extubation in OR  Informed Consent: I have reviewed the patients History and Physical, chart, labs and discussed the procedure including the risks, benefits and alternatives for the proposed anesthesia with the patient or authorized representative who has indicated his/her understanding and acceptance.     Dental advisory given  Plan Discussed with: CRNA  Anesthesia Plan Comments:        Anesthesia Quick Evaluation

## 2019-03-11 ENCOUNTER — Ambulatory Visit (HOSPITAL_COMMUNITY): Payer: Medicare Other

## 2019-03-11 ENCOUNTER — Encounter (HOSPITAL_COMMUNITY)
Admission: RE | Disposition: A | Discharge status: Home / Self Care | Payer: Self-pay | Source: Home / Self Care | Attending: Neurosurgery

## 2019-03-11 ENCOUNTER — Ambulatory Visit (HOSPITAL_COMMUNITY): Payer: Medicare Other | Admitting: Anesthesiology

## 2019-03-11 ENCOUNTER — Observation Stay (HOSPITAL_COMMUNITY)
Admission: RE | Admit: 2019-03-11 | Discharge: 2019-03-12 | Disposition: A | Discharge status: Home / Self Care | Payer: Medicare Other | Attending: Neurosurgery | Admitting: Neurosurgery

## 2019-03-11 ENCOUNTER — Encounter (HOSPITAL_COMMUNITY): Payer: Self-pay | Admitting: Neurosurgery

## 2019-03-11 ENCOUNTER — Other Ambulatory Visit: Payer: Self-pay

## 2019-03-11 DIAGNOSIS — G9741 Accidental puncture or laceration of dura during a procedure: Secondary | ICD-10-CM | POA: Diagnosis not present

## 2019-03-11 DIAGNOSIS — Z419 Encounter for procedure for purposes other than remedying health state, unspecified: Secondary | ICD-10-CM

## 2019-03-11 DIAGNOSIS — Z85828 Personal history of other malignant neoplasm of skin: Secondary | ICD-10-CM | POA: Diagnosis not present

## 2019-03-11 DIAGNOSIS — Z79899 Other long term (current) drug therapy: Secondary | ICD-10-CM | POA: Diagnosis not present

## 2019-03-11 DIAGNOSIS — Z981 Arthrodesis status: Secondary | ICD-10-CM | POA: Diagnosis not present

## 2019-03-11 DIAGNOSIS — M549 Dorsalgia, unspecified: Secondary | ICD-10-CM | POA: Diagnosis present

## 2019-03-11 DIAGNOSIS — M48062 Spinal stenosis, lumbar region with neurogenic claudication: Secondary | ICD-10-CM | POA: Diagnosis not present

## 2019-03-11 DIAGNOSIS — I10 Essential (primary) hypertension: Secondary | ICD-10-CM | POA: Diagnosis not present

## 2019-03-11 DIAGNOSIS — M5416 Radiculopathy, lumbar region: Secondary | ICD-10-CM | POA: Diagnosis not present

## 2019-03-11 DIAGNOSIS — K219 Gastro-esophageal reflux disease without esophagitis: Secondary | ICD-10-CM | POA: Diagnosis not present

## 2019-03-11 HISTORY — PX: LUMBAR LAMINECTOMY/DECOMPRESSION MICRODISCECTOMY: SHX5026

## 2019-03-11 SURGERY — LUMBAR LAMINECTOMY/DECOMPRESSION MICRODISCECTOMY 2 LEVELS
Anesthesia: General | Laterality: Bilateral

## 2019-03-11 MED ORDER — OXYCODONE HCL 5 MG PO TABS
5.0000 mg | ORAL_TABLET | ORAL | Status: DC | PRN
  Administered 2019-03-12 (×2): 5 mg via ORAL
  Filled 2019-03-11 (×2): qty 1

## 2019-03-11 MED ORDER — LACTATED RINGERS IV SOLN: INTRAVENOUS | Status: DC

## 2019-03-11 MED ORDER — SODIUM CHLORIDE 0.9% FLUSH
3.0000 mL | Freq: Two times a day (BID) | INTRAVENOUS | Status: DC
  Administered 2019-03-11: 3 mL via INTRAVENOUS

## 2019-03-11 MED ORDER — BACITRACIN ZINC 500 UNIT/GM EX OINT
TOPICAL_OINTMENT | CUTANEOUS | Status: AC
  Filled 2019-03-11: qty 28.35

## 2019-03-11 MED ORDER — ROCURONIUM BROMIDE 10 MG/ML (PF) SYRINGE
PREFILLED_SYRINGE | INTRAVENOUS | Status: DC | PRN
  Administered 2019-03-11: 20 mg via INTRAVENOUS
  Administered 2019-03-11: 60 mg via INTRAVENOUS

## 2019-03-11 MED ORDER — HYDROCHLOROTHIAZIDE 25 MG PO TABS: 12.5000 mg | ORAL_TABLET | Freq: Every day | ORAL | Status: DC

## 2019-03-11 MED ORDER — THROMBIN 5000 UNITS EX SOLR
CUTANEOUS | Status: DC | PRN
  Administered 2019-03-11: 10000 [IU] via TOPICAL

## 2019-03-11 MED ORDER — MIDAZOLAM HCL 2 MG/2ML IJ SOLN
INTRAMUSCULAR | Status: AC
  Filled 2019-03-11: qty 2

## 2019-03-11 MED ORDER — ACETAMINOPHEN 650 MG RE SUPP: 650.0000 mg | RECTAL | Status: DC | PRN

## 2019-03-11 MED ORDER — ZOLPIDEM TARTRATE 5 MG PO TABS: 5.0000 mg | ORAL_TABLET | Freq: Every evening | ORAL | Status: DC | PRN

## 2019-03-11 MED ORDER — DOCUSATE SODIUM 100 MG PO CAPS
100.0000 mg | ORAL_CAPSULE | Freq: Two times a day (BID) | ORAL | Status: DC
  Administered 2019-03-11 (×2): 100 mg via ORAL
  Filled 2019-03-11 (×2): qty 1

## 2019-03-11 MED ORDER — HYDROXYZINE HCL 50 MG/ML IM SOLN: 50.0000 mg | Freq: Four times a day (QID) | INTRAMUSCULAR | Status: DC | PRN

## 2019-03-11 MED ORDER — BACITRACIN ZINC 500 UNIT/GM EX OINT
TOPICAL_OINTMENT | CUTANEOUS | Status: DC | PRN
  Administered 2019-03-11: 1 via TOPICAL

## 2019-03-11 MED ORDER — HEMOSTATIC AGENTS (NO CHARGE) OPTIME
TOPICAL | Status: DC | PRN
  Administered 2019-03-11: 1 via TOPICAL

## 2019-03-11 MED ORDER — ONDANSETRON HCL 4 MG PO TABS: 4.0000 mg | ORAL_TABLET | Freq: Four times a day (QID) | ORAL | Status: DC | PRN

## 2019-03-11 MED ORDER — CYCLOBENZAPRINE HCL 10 MG PO TABS: 10.0000 mg | ORAL_TABLET | Freq: Three times a day (TID) | ORAL | Status: DC | PRN

## 2019-03-11 MED ORDER — HYDROXYZINE HCL 25 MG PO TABS
25.0000 mg | ORAL_TABLET | Freq: Every day | ORAL | Status: DC
  Administered 2019-03-11: 25 mg via ORAL
  Filled 2019-03-11: qty 1

## 2019-03-11 MED ORDER — FENTANYL CITRATE (PF) 250 MCG/5ML IJ SOLN
INTRAMUSCULAR | Status: DC | PRN
  Administered 2019-03-11: 50 ug via INTRAVENOUS

## 2019-03-11 MED ORDER — OXYCODONE HCL 5 MG/5ML PO SOLN: 5.0000 mg | Freq: Once | ORAL | Status: DC | PRN

## 2019-03-11 MED ORDER — MENTHOL 3 MG MT LOZG: 1.0000 | LOZENGE | OROMUCOSAL | Status: DC | PRN

## 2019-03-11 MED ORDER — BISACODYL 10 MG RE SUPP: 10.0000 mg | Freq: Every day | RECTAL | Status: DC | PRN

## 2019-03-11 MED ORDER — MORPHINE SULFATE (PF) 4 MG/ML IV SOLN: 4.0000 mg | INTRAVENOUS | Status: DC | PRN

## 2019-03-11 MED ORDER — CHLORHEXIDINE GLUCONATE CLOTH 2 % EX PADS: 6.0000 | MEDICATED_PAD | Freq: Once | CUTANEOUS | Status: DC

## 2019-03-11 MED ORDER — ACETAMINOPHEN 500 MG PO TABS
1000.0000 mg | ORAL_TABLET | Freq: Once | ORAL | Status: AC
  Administered 2019-03-11: 06:00:00 1000 mg via ORAL
  Filled 2019-03-11: qty 2

## 2019-03-11 MED ORDER — PROMETHAZINE HCL 25 MG/ML IJ SOLN: 6.2500 mg | INTRAMUSCULAR | Status: DC | PRN

## 2019-03-11 MED ORDER — ONDANSETRON HCL 4 MG/2ML IJ SOLN
INTRAMUSCULAR | Status: DC | PRN
  Administered 2019-03-11: 4 mg via INTRAVENOUS

## 2019-03-11 MED ORDER — THROMBIN 5000 UNITS EX SOLR
CUTANEOUS | Status: AC
  Filled 2019-03-11: qty 5000

## 2019-03-11 MED ORDER — FENTANYL CITRATE (PF) 250 MCG/5ML IJ SOLN
INTRAMUSCULAR | Status: AC
  Filled 2019-03-11: qty 5

## 2019-03-11 MED ORDER — PHENYLEPHRINE HCL-NACL 10-0.9 MG/250ML-% IV SOLN
INTRAVENOUS | Status: DC | PRN
  Administered 2019-03-11: 10 ug/min via INTRAVENOUS

## 2019-03-11 MED ORDER — BUPIVACAINE-EPINEPHRINE (PF) 0.25% -1:200000 IJ SOLN
INTRAMUSCULAR | Status: DC | PRN
  Administered 2019-03-11: 10 mL

## 2019-03-11 MED ORDER — OXYCODONE HCL 5 MG PO TABS: 5.0000 mg | ORAL_TABLET | Freq: Once | ORAL | Status: DC | PRN

## 2019-03-11 MED ORDER — HYDROMORPHONE HCL 1 MG/ML IJ SOLN
INTRAMUSCULAR | Status: AC
  Filled 2019-03-11: qty 1

## 2019-03-11 MED ORDER — RAMIPRIL 10 MG PO CAPS
10.0000 mg | ORAL_CAPSULE | Freq: Two times a day (BID) | ORAL | Status: DC
  Administered 2019-03-11: 10 mg via ORAL
  Filled 2019-03-11 (×3): qty 1

## 2019-03-11 MED ORDER — SUGAMMADEX SODIUM 200 MG/2ML IV SOLN
INTRAVENOUS | Status: DC | PRN
  Administered 2019-03-11: 200 mg via INTRAVENOUS

## 2019-03-11 MED ORDER — PROPOFOL 10 MG/ML IV BOLUS
INTRAVENOUS | Status: AC
  Filled 2019-03-11: qty 40

## 2019-03-11 MED ORDER — DEXAMETHASONE SODIUM PHOSPHATE 10 MG/ML IJ SOLN
INTRAMUSCULAR | Status: DC | PRN
  Administered 2019-03-11: 10 mg via INTRAVENOUS

## 2019-03-11 MED ORDER — BUPIVACAINE-EPINEPHRINE (PF) 0.25% -1:200000 IJ SOLN
INTRAMUSCULAR | Status: AC
  Filled 2019-03-11: qty 30

## 2019-03-11 MED ORDER — CEFAZOLIN SODIUM-DEXTROSE 2-4 GM/100ML-% IV SOLN
2.0000 g | Freq: Three times a day (TID) | INTRAVENOUS | Status: AC
  Administered 2019-03-11 (×2): 2 g via INTRAVENOUS
  Filled 2019-03-11 (×2): qty 100

## 2019-03-11 MED ORDER — ONDANSETRON HCL 4 MG/2ML IJ SOLN: 4.0000 mg | Freq: Four times a day (QID) | INTRAMUSCULAR | Status: DC | PRN

## 2019-03-11 MED ORDER — PHENOL 1.4 % MT LIQD: 1.0000 | OROMUCOSAL | Status: DC | PRN

## 2019-03-11 MED ORDER — CEFAZOLIN SODIUM-DEXTROSE 2-4 GM/100ML-% IV SOLN
2.0000 g | INTRAVENOUS | Status: AC
  Administered 2019-03-11: 2 g via INTRAVENOUS
  Filled 2019-03-11: qty 100

## 2019-03-11 MED ORDER — THROMBIN 5000 UNITS EX SOLR
CUTANEOUS | Status: AC
  Filled 2019-03-11: qty 10000

## 2019-03-11 MED ORDER — ACETAMINOPHEN 500 MG PO TABS
1000.0000 mg | ORAL_TABLET | Freq: Four times a day (QID) | ORAL | Status: AC
  Administered 2019-03-11 – 2019-03-12 (×4): 1000 mg via ORAL
  Filled 2019-03-11 (×4): qty 2

## 2019-03-11 MED ORDER — OXYCODONE HCL 5 MG PO TABS
10.0000 mg | ORAL_TABLET | ORAL | Status: DC | PRN
  Administered 2019-03-11: 10 mg via ORAL
  Filled 2019-03-11 (×2): qty 2

## 2019-03-11 MED ORDER — SODIUM CHLORIDE 0.9 % IV SOLN
INTRAVENOUS | Status: DC | PRN
  Administered 2019-03-11: 500 mL

## 2019-03-11 MED ORDER — HYDROMORPHONE HCL 1 MG/ML IJ SOLN
0.2500 mg | INTRAMUSCULAR | Status: DC | PRN
  Administered 2019-03-11 (×2): 0.5 mg via INTRAVENOUS

## 2019-03-11 MED ORDER — ONDANSETRON HCL 4 MG/2ML IJ SOLN
INTRAMUSCULAR | Status: AC
  Filled 2019-03-11: qty 2

## 2019-03-11 MED ORDER — ACETAMINOPHEN 325 MG PO TABS: 650.0000 mg | ORAL_TABLET | ORAL | Status: DC | PRN

## 2019-03-11 MED ORDER — GLYCOPYRROLATE PF 0.2 MG/ML IJ SOSY
PREFILLED_SYRINGE | INTRAMUSCULAR | Status: DC | PRN
  Administered 2019-03-11: .1 mg via INTRAVENOUS

## 2019-03-11 MED ORDER — MIDAZOLAM HCL 2 MG/2ML IJ SOLN
INTRAMUSCULAR | Status: DC | PRN
  Administered 2019-03-11: 2 mg via INTRAVENOUS

## 2019-03-11 MED ORDER — LIDOCAINE 2% (20 MG/ML) 5 ML SYRINGE
INTRAMUSCULAR | Status: DC | PRN
  Administered 2019-03-11: 60 mg via INTRAVENOUS

## 2019-03-11 MED ORDER — 0.9 % SODIUM CHLORIDE (POUR BTL) OPTIME
TOPICAL | Status: DC | PRN
  Administered 2019-03-11: 1000 mL

## 2019-03-11 MED ORDER — PROPOFOL 10 MG/ML IV BOLUS
INTRAVENOUS | Status: DC | PRN
  Administered 2019-03-11: 150 mg via INTRAVENOUS

## 2019-03-11 MED ORDER — THROMBIN 5000 UNITS EX SOLR
OROMUCOSAL | Status: DC | PRN
  Administered 2019-03-11: 5 mL via TOPICAL

## 2019-03-11 MED ORDER — SODIUM CHLORIDE 0.9% FLUSH: 3.0000 mL | INTRAVENOUS | Status: DC | PRN

## 2019-03-11 SURGICAL SUPPLY — 53 items
APL SKNCLS STERI-STRIP NONHPOA (GAUZE/BANDAGES/DRESSINGS) ×1
BAG DECANTER FOR FLEXI CONT (MISCELLANEOUS) ×2 IMPLANT
BENZOIN TINCTURE PRP APPL 2/3 (GAUZE/BANDAGES/DRESSINGS) ×2 IMPLANT
BLADE CLIPPER SURG (BLADE) IMPLANT
BUR MATCHSTICK NEURO 3.0 LAGG (BURR) ×2 IMPLANT
BUR PRECISION FLUTE 6.0 (BURR) ×2 IMPLANT
CANISTER SUCT 3000ML PPV (MISCELLANEOUS) ×2 IMPLANT
CARTRIDGE OIL MAESTRO DRILL (MISCELLANEOUS) ×1 IMPLANT
COVER WAND RF STERILE (DRAPES) ×2 IMPLANT
DIFFUSER DRILL AIR PNEUMATIC (MISCELLANEOUS) ×2 IMPLANT
DRAPE LAPAROTOMY 100X72X124 (DRAPES) ×2 IMPLANT
DRAPE MICROSCOPE LEICA (MISCELLANEOUS) ×2 IMPLANT
DRAPE SURG 17X23 STRL (DRAPES) ×8 IMPLANT
DRSG OPSITE POSTOP 4X6 (GAUZE/BANDAGES/DRESSINGS) ×1 IMPLANT
DURASEAL SPINE SEALANT 3ML (MISCELLANEOUS) ×1 IMPLANT
ELECT BLADE 4.0 EZ CLEAN MEGAD (MISCELLANEOUS) ×2
ELECT REM PT RETURN 9FT ADLT (ELECTROSURGICAL) ×2
ELECTRODE BLDE 4.0 EZ CLN MEGD (MISCELLANEOUS) ×1 IMPLANT
ELECTRODE REM PT RTRN 9FT ADLT (ELECTROSURGICAL) ×1 IMPLANT
GAUZE 4X4 16PLY RFD (DISPOSABLE) IMPLANT
GAUZE SPONGE 4X4 12PLY STRL (GAUZE/BANDAGES/DRESSINGS) ×2 IMPLANT
GLOVE BIO SURGEON STRL SZ8 (GLOVE) ×2 IMPLANT
GLOVE BIO SURGEON STRL SZ8.5 (GLOVE) ×2 IMPLANT
GLOVE EXAM NITRILE XL STR (GLOVE) IMPLANT
GOWN STRL REUS W/ TWL LRG LVL3 (GOWN DISPOSABLE) IMPLANT
GOWN STRL REUS W/ TWL XL LVL3 (GOWN DISPOSABLE) ×1 IMPLANT
GOWN STRL REUS W/TWL 2XL LVL3 (GOWN DISPOSABLE) IMPLANT
GOWN STRL REUS W/TWL LRG LVL3 (GOWN DISPOSABLE)
GOWN STRL REUS W/TWL XL LVL3 (GOWN DISPOSABLE) ×2
HEMOSTAT POWDER KIT SURGIFOAM (HEMOSTASIS) ×1 IMPLANT
KIT BASIN OR (CUSTOM PROCEDURE TRAY) ×2 IMPLANT
KIT TURNOVER KIT B (KITS) ×2 IMPLANT
NDL HYPO 21X1.5 SAFETY (NEEDLE) IMPLANT
NEEDLE HYPO 21X1.5 SAFETY (NEEDLE) IMPLANT
NEEDLE HYPO 22GX1.5 SAFETY (NEEDLE) ×2 IMPLANT
NS IRRIG 1000ML POUR BTL (IV SOLUTION) ×2 IMPLANT
OIL CARTRIDGE MAESTRO DRILL (MISCELLANEOUS) ×2
PACK LAMINECTOMY NEURO (CUSTOM PROCEDURE TRAY) ×2 IMPLANT
PAD ARMBOARD 7.5X6 YLW CONV (MISCELLANEOUS) ×6 IMPLANT
PATTIES SURGICAL .5 X.5 (GAUZE/BANDAGES/DRESSINGS) ×1 IMPLANT
PATTIES SURGICAL .5 X1 (DISPOSABLE) IMPLANT
PATTIES SURGICAL 1X1 (DISPOSABLE) ×1 IMPLANT
RUBBERBAND STERILE (MISCELLANEOUS) ×4 IMPLANT
SPONGE SURGIFOAM ABS GEL SZ50 (HEMOSTASIS) ×2 IMPLANT
STRIP CLOSURE SKIN 1/2X4 (GAUZE/BANDAGES/DRESSINGS) ×2 IMPLANT
SUT ETHILON 2 0 PSLX (SUTURE) ×1 IMPLANT
SUT PROLENE 6 0 BV (SUTURE) ×2 IMPLANT
SUT VIC AB 1 CT1 18XBRD ANBCTR (SUTURE) ×2 IMPLANT
SUT VIC AB 1 CT1 8-18 (SUTURE) ×4
SUT VIC AB 2-0 CP2 18 (SUTURE) ×4 IMPLANT
TOWEL GREEN STERILE (TOWEL DISPOSABLE) ×2 IMPLANT
TOWEL GREEN STERILE FF (TOWEL DISPOSABLE) ×2 IMPLANT
WATER STERILE IRR 1000ML POUR (IV SOLUTION) ×2 IMPLANT

## 2019-03-11 NOTE — Progress Notes (Signed)
   Providing Compassionate, Quality Care - Together   Subjective: Patient reports back pain and a mild headache.  Objective: Vital signs in last 24 hours: Temp:  [98.2 F (36.8 C)-98.3 F (36.8 C)] 98.2 F (36.8 C) (01/11 1118) Pulse Rate:  [68-85] 83 (01/11 1133) Resp:  [14-16] 15 (01/11 1133) BP: (129-136)/(61-65) 129/65 (01/11 1133) SpO2:  [93 %-100 %] 98 % (01/11 1133) Weight:  [90 kg] 90 kg (01/11 0606)  Intake/Output from previous day: No intake/output data recorded. Intake/Output this shift: Total I/O In: 1200 [I.V.:1200] Out: 150 [Blood:150]  Alert and oriented x 4 PERRLA CN II-XII grossly intact MAE, Strength and sensation intact Incision is covered with Honeycomb dressing and Steri Strips; Dressing is clean, dry, and intact  Lab Results: No results for input(s): WBC, HGB, HCT, PLT in the last 72 hours. BMET No results for input(s): NA, K, CL, CO2, GLUCOSE, BUN, CREATININE, CALCIUM in the last 72 hours.  Studies/Results: No results found.  Assessment/Plan: Patient is status post L3-4 and L4-5 laminotomy/foraminotomy by Dr. Arnoldo Morale. She is moving all extremities well and sensation is intact. She will be admitted for observation and likely discharged tomorrow.   LOS: 0 days     Viona Gilmore, DNP, AGNP-C Nurse Practitioner  [facility] [place] [address] [place], [place], [place] [address] P: [phone]    F: [phone]  03/11/2019, 11:44 AM

## 2019-03-11 NOTE — H&P (Signed)
Subjective: The patient is a 74 year old white female who has complained of back and left great and right leg pain consistent with neurogenic claudication.  She has failed medical management.  She was worked up with lumbar x-rays and lumbar MRI which demonstrated L3-4 and L4-5 spinal stenosis.  I discussed the various treatment options.  She has decided to proceed with surgery.  Past Medical History:  Diagnosis Date  . Arthritis   . GERD (gastroesophageal reflux disease)   . Hypertension   . Skin cancer    basal cell    Past Surgical History:  Procedure Laterality Date  . ABDOMINAL HYSTERECTOMY      Allergies  Allergen Reactions  . Amoxapine And Related Nausea Only    Social History   Tobacco Use  . Smoking status: Never Smoker  . Smokeless tobacco: Never Used  Substance Use Topics  . Alcohol use: Never    History reviewed. No pertinent family history. Prior to Admission medications   Medication Sig Start Date End Date Taking? Authorizing Provider  cholecalciferol (VITAMIN D3) 25 MCG (1000 UT) tablet Take 1,000 Units by mouth daily.   Yes [provider]  Flaxseed, Linseed, (FLAX SEED OIL) 1000 MG CAPS Take 1,000 mg by mouth daily.    Yes [provider]  hydrochlorothiazide (HYDRODIURIL) 12.5 MG tablet Take 12.5 mg by mouth daily.  07/27/18  Yes [provider]  hydrOXYzine (ATARAX/VISTARIL) 25 MG tablet Take 25 mg by mouth daily.  03/12/18  Yes [provider]  Magnesium 500 MG TABS Take 500 mg by mouth at bedtime.    Yes [provider]  ramipril (ALTACE) 10 MG capsule Take 10 mg by mouth 2 (two) times daily.  06/01/18  Yes [provider]  RED YEAST RICE EXTRACT PO Take 1 capsule by mouth daily. Take 600 mg daily   Yes [provider]     Review of Systems  Positive ROS: As above  All other systems have been reviewed and were otherwise negative with the exception of those mentioned in the HPI and as  above.  Objective: Vital signs in last 24 hours: Temp:  [98.3 F (36.8 C)] 98.3 F (36.8 C) (01/11 0606) Pulse Rate:  [68] 68 (01/11 0606) Resp:  [16] 16 (01/11 0606) BP: (133)/(61) 133/61 (01/11 0606) SpO2:  [93 %] 93 % (01/11 0606) Weight:  [90 kg] 90 kg (01/11 0606) Estimated body mass index is 36.27 kg/m as calculated from the following:   Height as of this encounter: 5\' 2"  (1.575 m).   Weight as of this encounter: 90 kg.   General Appearance: Alert Head: Normocephalic, without obvious abnormality, atraumatic Eyes: PERRL, conjunctiva/corneas clear, EOM's intact,    Ears: Normal  Throat: Normal  Neck: Supple, Back: unremarkable Lungs: Clear to auscultation bilaterally, respirations unlabored Heart: Regular rate and rhythm, no murmur, rub or gallop Abdomen: Soft, non-tender Extremities: Extremities normal, atraumatic, no cyanosis or edema Skin: unremarkable  NEUROLOGIC:   Mental status: alert and oriented,Motor Exam - grossly normal Sensory Exam - grossly normal Reflexes:  Coordination - grossly normal Gait - grossly normal Balance - grossly normal Cranial Nerves: I: smell Not tested  II: visual acuity  OS: Normal  OD: Normal   II: visual fields Full to confrontation  II: pupils Equal, round, reactive to light  III,VII: ptosis None  III,IV,VI: extraocular muscles  Full ROM  V: mastication Normal  V: facial light touch sensation  Normal  V,VII: corneal reflex  Present  VII: facial  muscle function - upper  Normal  VII: facial muscle function - lower Normal  VIII: hearing Not tested  IX: soft palate elevation  Normal  IX,X: gag reflex Present  XI: trapezius strength  5/5  XI: sternocleidomastoid strength 5/5  XI: neck flexion strength  5/5  XII: tongue strength  Normal    Data Review Lab Results  Component Value Date   WBC 5.6 03/07/2019   HGB 15.4 (H) 03/07/2019   HCT 46.1 (H) 03/07/2019   MCV 87.3 03/07/2019   PLT 308 03/07/2019   Lab Results   Component Value Date   NA 143 03/07/2019   K 3.9 03/07/2019   CL 109 03/07/2019   CO2 26 03/07/2019   BUN 19 03/07/2019   CREATININE 0.87 03/07/2019   GLUCOSE 126 (H) 03/07/2019   No results found for: INR, PROTIME  Assessment/Plan: L3-4 and L4-5 spinal stenosis, lumbago, lumbar radiculopathy, neurogenic claudication: I have discussed the situation with the patient.  I have reviewed her imaging studies with her and pointed out the abnormalities.  We have discussed the various treatment options including surgery.  I have described the surgical treatment option of a bilateral L3-4 and L4-5 laminotomy/foraminotomies.  I have shown her surgical models.  I have given her a surgical pamphlet.  We have discussed the risks, benefits, alternatives, expected postop course, and likelihood of achieving our goals with surgery.  I have answered all her questions.  She has decided to proceed with surgery.   Ophelia Charter 03/11/2019 7:28 AM

## 2019-03-11 NOTE — Anesthesia Procedure Notes (Signed)
Procedure Name: Intubation Date/Time: 03/11/2019 7:41 AM Performed by: Valda Favia, CRNA Pre-anesthesia Checklist: Patient identified, Emergency Drugs available, Suction available, Patient being monitored and Timeout performed Patient Re-evaluated:Patient Re-evaluated prior to induction Oxygen Delivery Method: Circle system utilized Preoxygenation: Pre-oxygenation with 100% oxygen Induction Type: IV induction Ventilation: Mask ventilation without difficulty Laryngoscope Size: Mac and 4 Grade View: Grade III Tube type: Oral Tube size: 7.0 mm Number of attempts: 1 Airway Equipment and Method: Stylet Placement Confirmation: ETT inserted through vocal cords under direct vision,  positive ETCO2 and breath sounds checked- equal and bilateral Secured at: 21 cm Tube secured with: Tape Dental Injury: Teeth and Oropharynx as per pre-operative assessment

## 2019-03-11 NOTE — Op Note (Signed)
Brief history: The patient is a 74 year old white female who has complained of back and left great and right leg pain consistent with neurogenic claudication.  She has failed medical management and was worked up with a lumbar MRI which demonstrated spinal stenosis at L3-4 and L4-5.  I discussed the various treatment options with her.  She has decided to proceed with bilateral L3-4 and L4-5 laminotomy/foraminotomies after weighing the risks, benefits and alternatives.  Preoperative diagnosis: L3-4 and L4-5 spinal stenosis, lumbar radiculopathy, lumbago, neurogenic claudication  Postoperative diagnosis: The same  Procedure: Bilateral L3-4 and L4-5 laminotomy/foraminotomy  using micro-dissection  Surgeon: Dr. Earle Gell  Asst.: Arnetha Massy nurse practitioner  Anesthesia: Gen. endotracheal  Estimated blood loss: 125 cc  Drains: None  Complications: Durotomy  Description of procedure: The patient was brought to the operating room by the anesthesia team. General endotracheal anesthesia was induced. The patient was turned to the prone position on the Wilson frame. The patient's lumbosacral region was then prepared with Betadine scrub and Betadine solution. Sterile drapes were applied.  I then injected the area to be incised with Marcaine with epinephrine solution. I then used a scalpel to make a linear midline incision over the L3-4 and L4-5 intervertebral disc space. I then used electrocautery to perform a bilateral subperiosteal dissection exposing the spinous process and lamina of L3-4 and L4-5. We obtained intraoperative radiograph to confirm our location. I then inserted the [facility] retractor for exposure.  We then brought the operative microscope into the field. Under its magnification and illumination we completed the microdissection. I used a high-speed drill to perform bilateral L3-4 and L4 laminotomies. I then used a Kerrison punches to widen the laminotomies at L3-4 and L4-5  bilaterally and removed the ligamentum flavum at L3-4 and L4-5 bilaterally. We then used microdissection to free up the thecal sac and the L4 and L5 nerve root from the epidural tissue. I then used a Kerrison punch to perform a foraminotomy at about the bilateral L4 and L5 nerve root.  In performing the foraminotomies L3-4 on the right we did create a durotomy.  I closed this with interrupted and running 3-0 nylon sutures.  I then had anesthesia Valsalva the patient.  The closure appeared good.  We inspected the bilateral intervertebral disc at L3-4 and L4-5.  There were no herniations.  I then palpated along the ventral surface of the thecal sac and along exit route of the L4 and L5 nerve root and noted that the neural structures were well decompressed. This completed the decompression.  We placed DuraSeal over the durotomy site.  We then obtained hemostasis using bipolar electrocautery. We irrigated the wound out with bacitracin solution. We then removed the retractor. We then reapproximated the patient's thoracolumbar fascia with interrupted #1 Vicryl suture. We then reapproximated the patient's subcutaneous tissue with interrupted 2-0 Vicryl suture. We then reapproximated patient's skin with a running 2-0 nylon suture. The was then coated with bacitracin ointment. The drapes were removed. The patient was subsequently returned to the supine position where they were extubated by the anesthesia team. The patient was then transported to the postanesthesia care unit in stable condition. All sponge instrument and needle counts were reportedly correct at the end of this case.

## 2019-03-11 NOTE — Transfer of Care (Signed)
Immediate Anesthesia Transfer of Care Note  Patient: Pam Porter  Procedure(s) Performed: LAMINECTOMY AND FORAMINOTOMY BILATERAL LUMBAR THREE- LUMBAR FOUR, LUMBAR FOUR- LUMBAR FIVE (Bilateral )  Patient Location: PACU  Anesthesia Type:General  Level of Consciousness: drowsy and patient cooperative  Airway & Oxygen Therapy: Patient Spontanous Breathing and Patient connected to nasal cannula oxygen  Post-op Assessment: Report given to RN and Post -op Vital signs reviewed and stable  Post vital signs: Reviewed and stable  Last Vitals:  Vitals Value Taken Time  BP 136/62 03/11/19 1118  Temp    Pulse 80 03/11/19 1121  Resp 14 03/11/19 1121  SpO2 99 % 03/11/19 1121  Vitals shown include unvalidated device data.  Last Pain:  Vitals:   03/11/19 0606  TempSrc: Oral  PainSc: 1       Patients Stated Pain Goal: 0 ([id number])  Complications: No apparent anesthesia complications

## 2019-03-11 NOTE — Anesthesia Postprocedure Evaluation (Signed)
Anesthesia Post Note  Patient: Pam Porter  Procedure(s) Performed: LAMINECTOMY AND FORAMINOTOMY BILATERAL LUMBAR THREE- LUMBAR FOUR, LUMBAR FOUR- LUMBAR FIVE (Bilateral )     Patient location during evaluation: PACU Anesthesia Type: General Level of consciousness: awake and alert Pain management: pain level controlled Vital Signs Assessment: post-procedure vital signs reviewed and stable Respiratory status: spontaneous breathing, nonlabored ventilation, respiratory function stable and patient connected to nasal cannula oxygen Cardiovascular status: blood pressure returned to baseline and stable Postop Assessment: no apparent nausea or vomiting Anesthetic complications: no    Last Vitals:  Vitals:   03/11/19 1227 03/11/19 1259  BP:  129/60  Pulse: 68 69  Resp: 10 18  Temp: 36.9 C 36.4 C  SpO2: 99% 95%    Last Pain:  Vitals:   03/11/19 1344  TempSrc:   PainSc: 7                  Angelie Kram P Terrionna Bridwell

## 2019-03-11 NOTE — Evaluation (Signed)
Physical Therapy Evaluation Patient Details Name: Pam Porter MRN: [id number] DOB: [birth year] Today's Date: 03/11/2019   History of Present Illness  Patient is a 74 y/o female who presents s/p L3-4 and L4-5 laminotomy/foraminotomy. PMH inculdes HTN.  Clinical Impression  Patient presents with pain, nausea and post surgical deficits s/p above surgery. Pt independent PTA, drives, cooks/cleans and lives with spouse. Today, pt requires min A-min guard for transfers and gait training. Recommend use of RW for home initially. Limited mainly by nausea and feeling weak. Education re: back precautions, log roll technique, walking program, positioning etc. Plan to stair training tomorrow as tolerated. Will follow acutely to maximize independence and mobility prior to return home.    Follow Up Recommendations No PT follow up;Supervision - Intermittent    Equipment Recommendations  None recommended by PT    Recommendations for Other Services       Precautions / Restrictions Precautions Precautions: Back Precaution Booklet Issued: Yes (comment) Precaution Comments: Reviewed back precautions and handout Restrictions Weight Bearing Restrictions: No      Mobility  Bed Mobility Overal bed mobility: Needs Assistance Bed Mobility: Rolling;Sidelying to Sit;Sit to Sidelying Rolling: Supervision Sidelying to sit: Supervision;HOB elevated     Sit to sidelying: Supervision;HOB elevated General bed mobility comments: Cues for log roll technique, use of rails.  Transfers Overall transfer level: Needs assistance Equipment used: None Transfers: Sit to/from Stand Sit to Stand: Min assist;Supervision         General transfer comment: Light Min A to rise from EOB initially, + nausea. Stood from [employer], from toilet x1.  Ambulation/Gait Ambulation/Gait assistance: Min guard Gait Distance (Feet): 100 Feet Assistive device: IV Pole;None Gait Pattern/deviations: Step-through  pattern;Decreased stride length Gait velocity: decreased Gait velocity interpretation: <1.31 ft/sec, indicative of household ambulator General Gait Details: Slow, guarded gait with IV pole for support initially progressing to no assist. + nausea limiting mobility.  Stairs            Wheelchair Mobility    Modified Rankin (Stroke Patients Only)       Balance Overall balance assessment: Needs assistance Sitting-balance support: Feet supported;No upper extremity supported Sitting balance-Leahy Scale: Fair Sitting balance - Comments: supervision for safety.   Standing balance support: During functional activity Standing balance-Leahy Scale: Fair Standing balance comment: Min guard for standing balance, able to wash hands at sink without difficulty, feels better with UE support for walking.                             Pertinent Vitals/Pain Pain Assessment: 0-10 Pain Score: 3  Pain Location: back Pain Descriptors / Indicators: Operative site guarding;Sore Pain Intervention(s): Repositioned;Monitored during session;Premedicated before session    [place] expects to be discharged to:: Private residence Living Arrangements: Spouse/significant other Available Help at Discharge: Family;Available 24 hours/day Type of Home: House Home Access: Stairs to enter Entrance Stairs-Rails: Right Entrance Stairs-Number of Steps: 4 Home Layout: One level Home Equipment: Walker - 2 wheels;Toilet riser;Shower seat - built in      Prior Function Level of Independence: Independent         Comments: Cooks, cleans, drives, walks.     Hand Dominance        Extremity/Trunk Assessment   Upper Extremity Assessment Upper Extremity Assessment: Defer to OT evaluation    Lower Extremity Assessment Lower Extremity Assessment: LLE deficits/detail LLE Deficits / Details: Grossly ~4/5 throughout LLE Sensation: decreased light touch  Cervical / Trunk  Assessment Cervical / Trunk Assessment: Other exceptions Cervical / Trunk Exceptions: s/p back surgery  Communication   Communication: No difficulties  Cognition Arousal/Alertness: Lethargic Behavior During Therapy: WFL for tasks assessed/performed Overall Cognitive Status: Within Functional Limits for tasks assessed                                        General Comments General comments (skin integrity, edema, etc.): Spouse present during session. Incision-clean, dry and intact.    Exercises     Assessment/Plan    PT Assessment Patient needs continued PT services  PT Problem List Decreased strength;Decreased mobility;Decreased knowledge of precautions;Decreased skin integrity;Pain;Impaired sensation;Decreased balance       PT Treatment Interventions Therapeutic activities;DME instruction;Gait training;Therapeutic exercise;Patient/family education;Balance training;Stair training;Functional mobility training    PT Goals (Current goals can be found in the Care Plan section)  Acute Rehab PT Goals Patient Stated Goal: to be able to walk PT Goal Formulation: With patient Time For Goal Achievement: 03/25/19 Potential to Achieve Goals: Good    Frequency Min 5X/week   Barriers to discharge Inaccessible home environment stairs    Co-evaluation               AM-PAC PT "6 Clicks" Mobility  Outcome Measure Help needed turning from your back to your side while in a flat bed without using bedrails?: A Little Help needed moving from lying on your back to sitting on the side of a flat bed without using bedrails?: A Little Help needed moving to and from a bed to a chair (including a wheelchair)?: A Little Help needed standing up from a chair using your arms (e.g., wheelchair or bedside chair)?: A Little Help needed to walk in hospital room?: A Little Help needed climbing 3-5 steps with a railing? : A Little 6 Click Score: 18    End of Session Equipment  Utilized During Treatment: Gait belt Activity Tolerance: Patient tolerated treatment well;Other (comment)(nausea) Patient left: in bed;with call bell/phone within reach;with SCD's reapplied;with family/visitor present Nurse Communication: Mobility status PT Visit Diagnosis: Pain;Muscle weakness (generalized) (M62.81);Difficulty in walking, not elsewhere classified (R26.2) Pain - part of body: (back)    Time: 1420-1446 PT Time Calculation (min) (ACUTE ONLY): 26 min   Charges:   PT Evaluation $PT Eval Moderate Complexity: 1 Mod PT Treatments $Gait Training: 8-22 mins        Marisa Severin, PT, DPT Acute Rehabilitation Services Pager [phone] Office [place] 03/11/2019, 2:55 PM

## 2019-03-12 DIAGNOSIS — Z79899 Other long term (current) drug therapy: Secondary | ICD-10-CM | POA: Diagnosis not present

## 2019-03-12 DIAGNOSIS — M48062 Spinal stenosis, lumbar region with neurogenic claudication: Secondary | ICD-10-CM | POA: Diagnosis not present

## 2019-03-12 DIAGNOSIS — M5416 Radiculopathy, lumbar region: Secondary | ICD-10-CM | POA: Diagnosis not present

## 2019-03-12 DIAGNOSIS — G9741 Accidental puncture or laceration of dura during a procedure: Secondary | ICD-10-CM | POA: Diagnosis not present

## 2019-03-12 DIAGNOSIS — Z85828 Personal history of other malignant neoplasm of skin: Secondary | ICD-10-CM | POA: Diagnosis not present

## 2019-03-12 DIAGNOSIS — I10 Essential (primary) hypertension: Secondary | ICD-10-CM | POA: Diagnosis not present

## 2019-03-12 MED ORDER — ALUM & MAG HYDROXIDE-SIMETH 200-200-20 MG/5ML PO SUSP
30.0000 mL | Freq: Four times a day (QID) | ORAL | Status: DC | PRN
  Administered 2019-03-12: 30 mL via ORAL
  Filled 2019-03-12: qty 30

## 2019-03-12 MED ORDER — OXYCODONE-ACETAMINOPHEN 5-325 MG PO TABS: 1.0000 | ORAL_TABLET | ORAL | Status: DC | PRN

## 2019-03-12 MED ORDER — CYCLOBENZAPRINE HCL 10 MG PO TABS: 10.0000 mg | ORAL_TABLET | Freq: Three times a day (TID) | ORAL | 0 refills | Status: DC | PRN

## 2019-03-12 MED ORDER — DOCUSATE SODIUM 100 MG PO CAPS: 100.0000 mg | ORAL_CAPSULE | Freq: Two times a day (BID) | ORAL | 0 refills | Status: DC

## 2019-03-12 MED ORDER — OXYCODONE-ACETAMINOPHEN 5-325 MG PO TABS: 1.0000 | ORAL_TABLET | ORAL | 0 refills | Status: DC | PRN

## 2019-03-12 NOTE — Discharge Summary (Signed)
Physician Discharge Summary  Patient ID: Pam Porter MRN: [id number] DOB/AGE: 05-26-1945 74 y.o.  Admit date: 03/11/2019 Discharge date: 03/12/2019  Admission Diagnoses: Lumbar spinal stenosis, lumbar radiculopathy, neurogenic claudication, lumbago  Discharge Diagnoses: The same Active Problems:   Lumbar stenosis with neurogenic claudication   Discharged Condition: good  Hospital Course: I performed an L3-4 laminectomy and L4-5 laminotomies on the patient on 03/11/2019.  At surgery there was a durotomy which was closed with 6-0 Prolene and DuraSeal.  The patient's postoperative course was unremarkable.  On postoperative day #1 she requested discharge to home.  The patient, and her husband, were given written and oral discharge instructions.  All her questions were answered.  Consults: Physical therapy Significant Diagnostic Studies: None Treatments: L3-4 laminectomy, bilateral L4-5 laminotomies using micro dissection Discharge Exam: Blood pressure (!) 105/47, pulse 80, temperature 98.9 F (37.2 C), temperature source Oral, resp. rate 18, height 5\' 2"  (1.575 m), weight 90 kg, SpO2 95 %. The patient is alert and pleasant.  Her strength is normal.  She looks well.  She denies headaches.  Disposition: Home  Discharge Instructions    Call MD for:  difficulty breathing, headache or visual disturbances   Complete by: As directed    Call MD for:  extreme fatigue   Complete by: As directed    Call MD for:  hives   Complete by: As directed    Call MD for:  persistant dizziness or light-headedness   Complete by: As directed    Call MD for:  persistant nausea and vomiting   Complete by: As directed    Call MD for:  redness, tenderness, or signs of infection (pain, swelling, redness, odor or green/yellow discharge around incision site)   Complete by: As directed    Call MD for:  severe uncontrolled pain   Complete by: As directed    Call MD for:  temperature >100.4   Complete by:  As directed    Diet - low sodium heart healthy   Complete by: As directed    Discharge instructions   Complete by: As directed    Call [phone] for a followup appointment. Take a stool softener while you are using pain medications.   Driving Restrictions   Complete by: As directed    Do not drive for 2 weeks.   Increase activity slowly   Complete by: As directed    Lifting restrictions   Complete by: As directed    Do not lift more than 5 pounds. No excessive bending or twisting.   May shower / Bathe   Complete by: As directed    Remove the dressing for 3 days after surgery.  You may shower, but leave the incision alone.   Remove dressing in 48 hours   Complete by: As directed    Your stitches are under the scan and will dissolve by themselves. The Steri-Strips will fall off after you take a few showers. Do not rub back or pick at the wound, Leave the wound alone.     Allergies as of 03/12/2019      Reactions   Amoxapine And Related Nausea Only      Medication List    TAKE these medications   cholecalciferol 25 MCG (1000 UNIT) tablet Commonly known as: VITAMIN D3 Take 1,000 Units by mouth daily.   cyclobenzaprine 10 MG tablet Commonly known as: FLEXERIL Take 1 tablet (10 mg total) by mouth 3 (three) times daily as needed for muscle spasms.  docusate sodium 100 MG capsule Commonly known as: COLACE Take 1 capsule (100 mg total) by mouth 2 (two) times daily.   Flax Seed Oil 1000 MG Caps Take 1,000 mg by mouth daily.   hydrochlorothiazide 12.5 MG tablet Commonly known as: HYDRODIURIL Take 12.5 mg by mouth daily.   hydrOXYzine 25 MG tablet Commonly known as: ATARAX/VISTARIL Take 25 mg by mouth daily.   Magnesium 500 MG Tabs Take 500 mg by mouth at bedtime.   oxyCODONE-acetaminophen 5-325 MG tablet Commonly known as: PERCOCET/ROXICET Take 1-2 tablets by mouth every 4 (four) hours as needed for moderate pain.   ramipril 10 MG capsule Commonly known as:  ALTACE Take 10 mg by mouth 2 (two) times daily.   RED YEAST RICE EXTRACT PO Take 1 capsule by mouth daily. Take 600 mg daily        Signed: Ophelia Charter 03/12/2019, 7:36 AM

## 2019-03-12 NOTE — Progress Notes (Addendum)
OT evaluation and Discharge   Patient admitted for back surgery.  Educated on back precautions, ADL compensatory techniques, recommendations, DME, safety and mobility.  She is completing LB ADLs with min assist, transfers with supervision, and mobility with supervision. Good awareness of back precautions, minimal cueing to adhere functionally.  Has support of spouse at discharge.  No further OT needs have been identified at this time.  OT will sign off.     03/12/19 0800  OT Visit Information  Last OT Received On 03/12/19  Assistance Needed +1  History of Present Illness Patient is a 74 y/o female who presents s/p L3-4 and L4-5 laminotomy/foraminotomy. PMH inculdes HTN.  Precautions  Precautions Back  Precaution Booklet Issued Yes (comment)  Precaution Comments Reviewed back precautions and handout  Required Braces or Orthoses Other Brace (no brace needed order)  Restrictions  Weight Bearing Restrictions No  Home Living  Family/patient expects to be discharged to: Private residence  Living Arrangements Spouse/significant other  Available Help at Discharge Family;Available 24 hours/day  Type of Home House  Home Access Stairs to enter  Entrance Stairs-Number of Steps 4  Entrance Stairs-Rails Right  Home Layout One level  Bathroom Shower/Tub Walk-in Patent examiner - 2 wheels;Toilet riser;Shower seat - built in  Prior Function  Level of Independence Independent  Comments Cooks, cleans, drives, walks.  Communication  Communication No difficulties  Pain Assessment  Pain Assessment Faces  Faces Pain Scale 2  Pain Location back  Pain Descriptors / Indicators Operative site guarding;Sore  Pain Intervention(s) Monitored during session;Repositioned  Cognition  Arousal/Alertness Awake/alert  Behavior During Therapy WFL for tasks assessed/performed  Overall Cognitive Status Within Functional Limits for tasks assessed  Upper Extremity Assessment   Upper Extremity Assessment Overall WFL for tasks assessed  Lower Extremity Assessment  Lower Extremity Assessment Defer to PT evaluation  Cervical / Trunk Assessment  Cervical / Trunk Assessment Other exceptions  Cervical / Trunk Exceptions s/p back surgery  ADL  Overall ADL's  Needs assistance/impaired  Grooming Supervision/safety;Standing  Upper Body Bathing Set up;Sitting  Lower Body Bathing Set up;Supervison/ safety;Sit to/from stand  Upper Body Dressing  Set up;Sitting  Lower Body Dressing Minimal assistance;Sit to/from stand  Lower Body Dressing Details (indicate cue type and reason) able to do figure 4 techniques but min assist to thread pants on R LE, plans to wear gowns at home or have spouse assist   Toilet Transfer Supervision/safety;Ambulation  Toilet Transfer Details (indicate cue type and reason) simulated in room  Functional mobility during ADLs Supervision/safety  General ADL Comments pt educated on back preacautions and ADl compensatory techniques  Vision- Assessment  Vision Assessment? No apparent visual deficits  Bed Mobility  General bed mobility comments EOB upon entry  Transfers  Overall transfer level Needs assistance  Transfers Sit to/from Stand  Sit to Stand Supervision  General transfer comment for safety, no assist required  Balance  Overall balance assessment Needs assistance  Sitting-balance support Feet supported;No upper extremity supported  Sitting balance-Leahy Scale Good  Standing balance support No upper extremity supported;During functional activity  Standing balance-Leahy Scale Fair  OT - End of Session  Activity Tolerance Patient tolerated treatment well  Patient left with call bell/phone within reach;Other (comment) (seated EOB )  Nurse Communication Mobility status  OT Assessment  OT Recommendation/Assessment Patient does not need any further OT services  OT Visit Diagnosis Pain  Pain - part of body  (back)  OT Problem List Impaired  balance (sitting and/or standing);Decreased knowledge of use of DME or AE;Decreased knowledge of precautions;Pain  AM-PAC OT "6 Clicks" Daily Activity Outcome Measure (Version 2)  Help from another person eating meals? 4  Help from another person taking care of personal grooming? 3  Help from another person toileting, which includes using toliet, bedpan, or urinal? 3  Help from another person bathing (including washing, rinsing, drying)? 3  Help from another person to put on and taking off regular upper body clothing? 3  Help from another person to put on and taking off regular lower body clothing? 3  6 Click Score 19  OT Recommendation  Follow Up Recommendations No OT follow up;Supervision - Intermittent  OT Equipment None recommended by OT  Acute Rehab OT Goals  Patient Stated Goal home today  OT Goal Formulation With patient  OT Time Calculation  OT Start Time (ACUTE ONLY) 0806  OT Stop Time (ACUTE ONLY) 0820  OT Time Calculation (min) 14 min  OT General Charges  $OT Visit 1 Visit  OT Evaluation  $OT Eval Low Complexity 1 Low    Jolaine Artist, OT Acute Rehabilitation Services Pager [phone] Office [phone]

## 2019-03-12 NOTE — Plan of Care (Signed)
Pt doing well. Pt and husband given D/C instructions with verbal understanding. Rx's were sent to pharmacy by MD. Pt's incision is clean and dry with no sign of infection. Pt's IV was removed prior to D/C. Pt D/C'd home via wheelchair per MD order. Pt is stable @ D/C and has no other needs at this time. Holli Humbles, RN

## 2019-03-12 NOTE — Progress Notes (Signed)
Physical Therapy Treatment and Discharge Patient Details Name: Pam Porter MRN: [id number] DOB: 1945/12/13 Today's Date: 03/12/2019    History of Present Illness Patient is a 74 y/o female who presents s/p L3-4 and L4-5 laminotomy/foraminotomy. PMH inculdes HTN.    PT Comments    Pt progressing well with post-op mobility. She was able to demonstrate transfers and ambulation with gross modified independence and no AD. Reinforced education on precautions, positioning recommendations, appropriate activity progression, and car transfer. Will sign off at this time as pt has met acute PT goals and anticipates d/c home this morning. If needs change, please reconsult.   Follow Up Recommendations  No PT follow up;Supervision - Intermittent     Equipment Recommendations  None recommended by PT    Recommendations for Other Services       Precautions / Restrictions Precautions Precautions: Back Precaution Booklet Issued: Yes (comment) Precaution Comments: Reviewed back precautions and handout Required Braces or Orthoses: Other Brace(no brace needed order) Restrictions Weight Bearing Restrictions: No    Mobility  Bed Mobility               General bed mobility comments: Reviewed log roll. Pt was received sitting up EOB  Transfers Overall transfer level: Modified independent Equipment used: None Transfers: Sit to/from Stand           General transfer comment: Good posture. No assist required.   Ambulation/Gait Ambulation/Gait assistance: Modified independent (Device/Increase time) Gait Distance (Feet): 250 Feet Assistive device: None Gait Pattern/deviations: Step-through pattern;Decreased stride length Gait velocity: decreased Gait velocity interpretation: 1.31 - 2.62 ft/sec, indicative of limited community ambulator General Gait Details: Slow, guarded gait with IV pole for support initially progressing to no assist. + nausea limiting  mobility.   Stairs Stairs: Yes Stairs assistance: Supervision Stair Management: One rail Right;Step to pattern;Forwards Number of Stairs: 3 General stair comments: VC's for sequencing and general safety. No assist required but light supervision provided for safety.    Wheelchair Mobility    Modified Rankin (Stroke Patients Only)       Balance Overall balance assessment: Needs assistance Sitting-balance support: Feet supported;No upper extremity supported Sitting balance-Leahy Scale: Good     Standing balance support: During functional activity Standing balance-Leahy Scale: Fair                              Cognition Arousal/Alertness: Awake/alert Behavior During Therapy: WFL for tasks assessed/performed Overall Cognitive Status: Within Functional Limits for tasks assessed                                        Exercises      General Comments        Pertinent Vitals/Pain Pain Assessment: Faces Faces Pain Scale: Hurts a little bit Pain Location: back Pain Descriptors / Indicators: Operative site guarding;Sore Pain Intervention(s): Limited activity within patient's tolerance;Monitored during session;Repositioned    Home Living Family/patient expects to be discharged to:: Private residence Living Arrangements: Spouse/significant other Available Help at Discharge: Family;Available 24 hours/day Type of Home: House Home Access: Stairs to enter Entrance Stairs-Rails: Right Home Layout: One level Home Equipment: Walker - 2 wheels;Toilet riser;Shower seat - built in      Prior Function Level of Independence: Independent      Comments: Cooks, cleans, drives, walks.   PT Goals (current goals can now be  found in the care plan section) Acute Rehab PT Goals Patient Stated Goal: Home today PT Goal Formulation: With patient Time For Goal Achievement: 03/25/19 Potential to Achieve Goals: Good Progress towards PT goals: Goals  met/education completed, patient discharged from PT    Frequency    Min 5X/week      PT Plan Current plan remains appropriate    Co-evaluation              AM-PAC PT "6 Clicks" Mobility   Outcome Measure  Help needed turning from your back to your side while in a flat bed without using bedrails?: None Help needed moving from lying on your back to sitting on the side of a flat bed without using bedrails?: None Help needed moving to and from a bed to a chair (including a wheelchair)?: None Help needed standing up from a chair using your arms (e.g., wheelchair or bedside chair)?: None Help needed to walk in hospital room?: None Help needed climbing 3-5 steps with a railing? : A Little 6 Click Score: 23    End of Session Equipment Utilized During Treatment: Gait belt Activity Tolerance: Patient tolerated treatment well Patient left: with family/visitor present(Sitting EOB with OT present. ) Nurse Communication: Mobility status PT Visit Diagnosis: Pain;Muscle weakness (generalized) (M62.81);Difficulty in walking, not elsewhere classified (R26.2) Pain - part of body: (back)     Time: 4765-4650 PT Time Calculation (min) (ACUTE ONLY): 15 min  Charges:  $Gait Training: 8-22 mins                     Rolinda Roan, PT, DPT Acute Rehabilitation Services Pager: [phone] Office: [phone]    Thelma Comp 03/12/2019, 9:11 AM

## 2019-03-12 NOTE — Discharge Instructions (Signed)
Call MD for: Difficulty breathing, headache or visual disturbances, extreme fatigue  Call MD for: hives  Call MD for: persistant dizziness or light-headedness  Call MD for: persistant nausea and vomiting  Call MD for: redness, tenderness, or signs of infection (pain, swelling, redness, odor or green/yellow discharge around incision site)  Call MD for: severe uncontrolled pain  Call MD for: temperature >100.4 Diet - low sodium heart healthy Discharge instructions   Call [phone] for a followup appointment.   Increase activity slowly Remove dressing in 48 hours    Laminectomy, Care After This sheet gives you information about how to care for yourself after your procedure. Your health care provider may also give you more specific instructions. If you have problems or questions, contact your health care provider. What can I expect after the procedure? After the procedure, it is common to have:  Some pain around your incision area.  Muscle tightening (spasms) across the back. Follow these instructions at home: Medicines  Take over-the-counter and prescription medicines only as told by your health care provider.  If you were prescribed an antibiotic medicine, use it as told by your health care provider. Do not stop using the antibiotic even if you start to feel better.  If you are taking blood thinners: ? Talk with your health care provider before you take any medicines that contain aspirin or NSAIDs, such as ibuprofen. These medicines increase your risk for dangerous bleeding. ? Take your medicine exactly as told, at the same time every day. ? Avoid activities that could cause injury or bruising, and follow instructions about how to prevent falls. ? Wear a medical alert bracelet or carry a card that lists what medicines you take.  Ask your health care provider if the medicine prescribed to you: ? Requires you to avoid driving or using heavy machinery. ? Can cause constipation. You  may need to take these actions to prevent or treat constipation:  Drink enough fluid to keep your urine pale yellow.  Take over-the-counter or prescription medicines.  Eat foods that are high in fiber, such as beans, whole grains, and fresh fruits and vegetables.  Limit foods that are high in fat and processed sugars, such as fried or sweet foods. ? Do not drink alcohol if you are taking prescription pain medicine. Incision care   Follow instructions from your health care provider about how to take care of your incision area. Make sure you: ? Wash your hands with soap and water before and after you apply medicine to the area or change your bandage (dressing). If soap and water are not available, use hand sanitizer. ? Change your dressing as told by your health care provider. ? Leave stitches (sutures), skin glue, or adhesive strips in place. These skin closures may need to stay in place for 2 weeks or longer. If adhesive strip edges start to loosen and curl up, you may trim the loose edges. Do not remove adhesive strips completely unless your health care provider tells you to do that.  Check your incision area every day for signs of infection. Check for: ? More redness, swelling, or pain. ? Fluid or blood. ? Warmth. ? Pus or a bad smell. Activity   Rest as told by your health care provider.  Avoid bending or twisting at your waist. Always bend at your knees.  Avoid sitting for a long time without moving. Get up to take short walks every 1-2 hours. This is important to improve blood flow and breathing.  Ask for help if you feel weak or unsteady.  Do not lift anything that is heavier than 10 lb (4.5 kg) or the limit that your health care provider tells you, until he or she says that it is safe.  Do exercises as told by your health care provider, including breathing exercises.  Return to your normal activities as told by your health care provider. Ask your health care provider what  activities are safe for you. General instructions   Do not drive for 2 weeks after your procedure or for as long as told by your health care provider.  Do not take baths, swim, or use a hot tub for 2 weeks, or until your incision has healed completely. Ask your health care provider if you may take showers after your dressing has been removed. You may only be allowed to take sponge baths.  Do not use any products that contain nicotine or tobacco, such as cigarettes, e-cigarettes, and chewing tobacco. These can delay bone healing after surgery. If you need help quitting, ask your health care provider.  Wear compression stockings as told by your health care provider. These stockings help to prevent blood clots and reduce swelling in your legs.  Keep all follow-up visits as told by your health care provider. This is important. Contact a health care provider if:  You have more redness, swelling, or pain around your incision area.  Your incision feels warm to the touch.  You are not able to return to activities or do exercises as told by your health care provider. Get help right away if you:  Have any of these signs of infection: ? Fluid or blood coming from your incision area. ? Pus or a bad smell coming from your incision area. ? Chills or a fever.  Feel dizzy or you faint while standing.  Develop a rash.  Develop shortness of breath or you have difficulty breathing.  Cannot control when you urinate or have a bowel movement.  Become weak.  Are not able to use your legs. These symptoms may represent a serious problem that is an emergency. Do not wait to see if the symptoms will go away. Get medical help right away. Call your local emergency services (911 in the [place] Do not drive yourself to the hospital. Summary  After the procedure, it is common to have some pain around your incision area. You may also have muscle tightening (spasms) across the back.  Follow instructions from  your health care provider about how to care for your incision area.  Do not lift anything that is heavier than 10 lb (4.5 kg) or the limit that your health care provider tells you, until he or she says that it is safe.  Contact your health care provider if you have more redness, swelling, or pain around your incision area or if your incision feels warm to the touch. These can be signs of infection. This information is not intended to replace advice given to you by your health care provider. Make sure you discuss any questions you have with your health care provider. Document Revised: 09/10/2018 Document Reviewed: 09/10/2018 Elsevier Patient Education  [place].

## 2019-03-22 DIAGNOSIS — E785 Hyperlipidemia, unspecified: Secondary | ICD-10-CM | POA: Diagnosis not present

## 2019-03-22 DIAGNOSIS — I10 Essential (primary) hypertension: Secondary | ICD-10-CM | POA: Diagnosis not present

## 2019-05-06 DIAGNOSIS — D2261 Melanocytic nevi of right upper limb, including shoulder: Secondary | ICD-10-CM | POA: Diagnosis not present

## 2019-05-06 DIAGNOSIS — D2272 Melanocytic nevi of left lower limb, including hip: Secondary | ICD-10-CM | POA: Diagnosis not present

## 2019-05-06 DIAGNOSIS — L820 Inflamed seborrheic keratosis: Secondary | ICD-10-CM | POA: Diagnosis not present

## 2019-05-06 DIAGNOSIS — L821 Other seborrheic keratosis: Secondary | ICD-10-CM | POA: Diagnosis not present

## 2019-05-06 DIAGNOSIS — L570 Actinic keratosis: Secondary | ICD-10-CM | POA: Diagnosis not present

## 2019-05-06 DIAGNOSIS — L814 Other melanin hyperpigmentation: Secondary | ICD-10-CM | POA: Diagnosis not present

## 2019-05-06 DIAGNOSIS — C44319 Basal cell carcinoma of skin of other parts of face: Secondary | ICD-10-CM | POA: Diagnosis not present

## 2019-05-06 DIAGNOSIS — D0472 Carcinoma in situ of skin of left lower limb, including hip: Secondary | ICD-10-CM | POA: Diagnosis not present

## 2019-05-06 DIAGNOSIS — Z85828 Personal history of other malignant neoplasm of skin: Secondary | ICD-10-CM | POA: Diagnosis not present

## 2019-05-06 DIAGNOSIS — L400 Psoriasis vulgaris: Secondary | ICD-10-CM | POA: Diagnosis not present

## 2019-05-06 DIAGNOSIS — D225 Melanocytic nevi of trunk: Secondary | ICD-10-CM | POA: Diagnosis not present

## 2019-05-06 DIAGNOSIS — D2262 Melanocytic nevi of left upper limb, including shoulder: Secondary | ICD-10-CM | POA: Diagnosis not present

## 2019-05-20 DIAGNOSIS — E785 Hyperlipidemia, unspecified: Secondary | ICD-10-CM | POA: Diagnosis not present

## 2019-05-20 DIAGNOSIS — I10 Essential (primary) hypertension: Secondary | ICD-10-CM | POA: Diagnosis not present

## 2019-05-27 DIAGNOSIS — Z85828 Personal history of other malignant neoplasm of skin: Secondary | ICD-10-CM | POA: Diagnosis not present

## 2019-05-27 DIAGNOSIS — C44319 Basal cell carcinoma of skin of other parts of face: Secondary | ICD-10-CM | POA: Diagnosis not present

## 2019-06-11 DIAGNOSIS — M48062 Spinal stenosis, lumbar region with neurogenic claudication: Secondary | ICD-10-CM | POA: Diagnosis not present

## 2019-06-20 DIAGNOSIS — E785 Hyperlipidemia, unspecified: Secondary | ICD-10-CM | POA: Diagnosis not present

## 2019-06-20 DIAGNOSIS — I10 Essential (primary) hypertension: Secondary | ICD-10-CM | POA: Diagnosis not present

## 2019-08-09 DIAGNOSIS — E785 Hyperlipidemia, unspecified: Secondary | ICD-10-CM | POA: Diagnosis not present

## 2019-08-09 DIAGNOSIS — I10 Essential (primary) hypertension: Secondary | ICD-10-CM | POA: Diagnosis not present

## 2019-09-10 DIAGNOSIS — Z6838 Body mass index (BMI) 38.0-38.9, adult: Secondary | ICD-10-CM | POA: Diagnosis not present

## 2019-09-10 DIAGNOSIS — Z1272 Encounter for screening for malignant neoplasm of vagina: Secondary | ICD-10-CM | POA: Diagnosis not present

## 2019-09-10 DIAGNOSIS — Z1231 Encounter for screening mammogram for malignant neoplasm of breast: Secondary | ICD-10-CM | POA: Diagnosis not present

## 2019-09-10 DIAGNOSIS — Z90710 Acquired absence of both cervix and uterus: Secondary | ICD-10-CM | POA: Diagnosis not present

## 2019-09-10 DIAGNOSIS — Z124 Encounter for screening for malignant neoplasm of cervix: Secondary | ICD-10-CM | POA: Diagnosis not present

## 2019-09-16 DIAGNOSIS — I10 Essential (primary) hypertension: Secondary | ICD-10-CM | POA: Diagnosis not present

## 2019-09-16 DIAGNOSIS — E785 Hyperlipidemia, unspecified: Secondary | ICD-10-CM | POA: Diagnosis not present

## 2019-10-22 DIAGNOSIS — Z23 Encounter for immunization: Secondary | ICD-10-CM | POA: Diagnosis not present

## 2019-11-14 DIAGNOSIS — R7309 Other abnormal glucose: Secondary | ICD-10-CM | POA: Diagnosis not present

## 2019-11-14 DIAGNOSIS — Z86010 Personal history of colonic polyps: Secondary | ICD-10-CM | POA: Diagnosis not present

## 2019-11-14 DIAGNOSIS — R7301 Impaired fasting glucose: Secondary | ICD-10-CM | POA: Diagnosis not present

## 2019-11-14 DIAGNOSIS — Z0000 Encounter for general adult medical examination without abnormal findings: Secondary | ICD-10-CM | POA: Diagnosis not present

## 2019-11-14 DIAGNOSIS — M48062 Spinal stenosis, lumbar region with neurogenic claudication: Secondary | ICD-10-CM | POA: Diagnosis not present

## 2019-11-14 DIAGNOSIS — I10 Essential (primary) hypertension: Secondary | ICD-10-CM | POA: Diagnosis not present

## 2019-11-14 DIAGNOSIS — E785 Hyperlipidemia, unspecified: Secondary | ICD-10-CM | POA: Diagnosis not present

## 2019-11-14 DIAGNOSIS — Z6839 Body mass index (BMI) 39.0-39.9, adult: Secondary | ICD-10-CM | POA: Diagnosis not present

## 2019-11-14 DIAGNOSIS — L309 Dermatitis, unspecified: Secondary | ICD-10-CM | POA: Diagnosis not present

## 2019-11-19 DIAGNOSIS — Z23 Encounter for immunization: Secondary | ICD-10-CM | POA: Diagnosis not present

## 2019-11-24 DIAGNOSIS — E1169 Type 2 diabetes mellitus with other specified complication: Secondary | ICD-10-CM | POA: Diagnosis not present

## 2019-11-24 DIAGNOSIS — I10 Essential (primary) hypertension: Secondary | ICD-10-CM | POA: Diagnosis not present

## 2019-11-24 DIAGNOSIS — E785 Hyperlipidemia, unspecified: Secondary | ICD-10-CM | POA: Diagnosis not present

## 2019-12-06 DIAGNOSIS — E1169 Type 2 diabetes mellitus with other specified complication: Secondary | ICD-10-CM | POA: Diagnosis not present

## 2019-12-06 DIAGNOSIS — E785 Hyperlipidemia, unspecified: Secondary | ICD-10-CM | POA: Diagnosis not present

## 2020-02-10 DIAGNOSIS — E1169 Type 2 diabetes mellitus with other specified complication: Secondary | ICD-10-CM | POA: Diagnosis not present

## 2020-02-10 DIAGNOSIS — E785 Hyperlipidemia, unspecified: Secondary | ICD-10-CM | POA: Diagnosis not present

## 2020-02-10 DIAGNOSIS — I10 Essential (primary) hypertension: Secondary | ICD-10-CM | POA: Diagnosis not present

## 2020-02-10 DIAGNOSIS — K219 Gastro-esophageal reflux disease without esophagitis: Secondary | ICD-10-CM | POA: Diagnosis not present

## 2020-03-09 DIAGNOSIS — E1169 Type 2 diabetes mellitus with other specified complication: Secondary | ICD-10-CM | POA: Diagnosis not present

## 2020-03-09 DIAGNOSIS — Z79899 Other long term (current) drug therapy: Secondary | ICD-10-CM | POA: Diagnosis not present

## 2020-03-09 DIAGNOSIS — E785 Hyperlipidemia, unspecified: Secondary | ICD-10-CM | POA: Diagnosis not present

## 2020-03-11 DIAGNOSIS — E785 Hyperlipidemia, unspecified: Secondary | ICD-10-CM | POA: Diagnosis not present

## 2020-03-11 DIAGNOSIS — Z6837 Body mass index (BMI) 37.0-37.9, adult: Secondary | ICD-10-CM | POA: Diagnosis not present

## 2020-03-11 DIAGNOSIS — Z79899 Other long term (current) drug therapy: Secondary | ICD-10-CM | POA: Diagnosis not present

## 2020-03-11 DIAGNOSIS — E1169 Type 2 diabetes mellitus with other specified complication: Secondary | ICD-10-CM | POA: Diagnosis not present

## 2020-04-09 DIAGNOSIS — Z01812 Encounter for preprocedural laboratory examination: Secondary | ICD-10-CM | POA: Diagnosis not present

## 2020-04-12 DIAGNOSIS — I10 Essential (primary) hypertension: Secondary | ICD-10-CM | POA: Diagnosis not present

## 2020-04-12 DIAGNOSIS — E1169 Type 2 diabetes mellitus with other specified complication: Secondary | ICD-10-CM | POA: Diagnosis not present

## 2020-04-12 DIAGNOSIS — K219 Gastro-esophageal reflux disease without esophagitis: Secondary | ICD-10-CM | POA: Diagnosis not present

## 2020-04-12 DIAGNOSIS — E785 Hyperlipidemia, unspecified: Secondary | ICD-10-CM | POA: Diagnosis not present

## 2020-05-11 DIAGNOSIS — D225 Melanocytic nevi of trunk: Secondary | ICD-10-CM | POA: Diagnosis not present

## 2020-05-11 DIAGNOSIS — L218 Other seborrheic dermatitis: Secondary | ICD-10-CM | POA: Diagnosis not present

## 2020-05-11 DIAGNOSIS — L821 Other seborrheic keratosis: Secondary | ICD-10-CM | POA: Diagnosis not present

## 2020-05-11 DIAGNOSIS — C44712 Basal cell carcinoma of skin of right lower limb, including hip: Secondary | ICD-10-CM | POA: Diagnosis not present

## 2020-05-11 DIAGNOSIS — Z85828 Personal history of other malignant neoplasm of skin: Secondary | ICD-10-CM | POA: Diagnosis not present

## 2020-05-11 DIAGNOSIS — L570 Actinic keratosis: Secondary | ICD-10-CM | POA: Diagnosis not present

## 2020-05-11 DIAGNOSIS — D2239 Melanocytic nevi of other parts of face: Secondary | ICD-10-CM | POA: Diagnosis not present

## 2020-05-11 DIAGNOSIS — L814 Other melanin hyperpigmentation: Secondary | ICD-10-CM | POA: Diagnosis not present

## 2020-06-04 DIAGNOSIS — E1169 Type 2 diabetes mellitus with other specified complication: Secondary | ICD-10-CM | POA: Diagnosis not present

## 2020-06-04 DIAGNOSIS — E785 Hyperlipidemia, unspecified: Secondary | ICD-10-CM | POA: Diagnosis not present

## 2020-06-04 DIAGNOSIS — I10 Essential (primary) hypertension: Secondary | ICD-10-CM | POA: Diagnosis not present

## 2020-06-04 DIAGNOSIS — K219 Gastro-esophageal reflux disease without esophagitis: Secondary | ICD-10-CM | POA: Diagnosis not present

## 2020-06-05 DIAGNOSIS — D123 Benign neoplasm of transverse colon: Secondary | ICD-10-CM | POA: Diagnosis not present

## 2020-06-05 DIAGNOSIS — K648 Other hemorrhoids: Secondary | ICD-10-CM | POA: Diagnosis not present

## 2020-06-05 DIAGNOSIS — Z86010 Personal history of colonic polyps: Secondary | ICD-10-CM | POA: Diagnosis not present

## 2020-06-05 DIAGNOSIS — D122 Benign neoplasm of ascending colon: Secondary | ICD-10-CM | POA: Diagnosis not present

## 2020-06-05 DIAGNOSIS — K635 Polyp of colon: Secondary | ICD-10-CM | POA: Diagnosis not present

## 2020-06-05 DIAGNOSIS — K5140 Inflammatory polyps of colon without complications: Secondary | ICD-10-CM | POA: Diagnosis not present

## 2020-06-05 LAB — HM COLONOSCOPY

## 2020-06-09 DIAGNOSIS — E1169 Type 2 diabetes mellitus with other specified complication: Secondary | ICD-10-CM | POA: Diagnosis not present

## 2020-06-09 DIAGNOSIS — D122 Benign neoplasm of ascending colon: Secondary | ICD-10-CM | POA: Diagnosis not present

## 2020-06-09 DIAGNOSIS — D123 Benign neoplasm of transverse colon: Secondary | ICD-10-CM | POA: Diagnosis not present

## 2020-06-09 DIAGNOSIS — Z79899 Other long term (current) drug therapy: Secondary | ICD-10-CM | POA: Diagnosis not present

## 2020-06-09 DIAGNOSIS — Z1159 Encounter for screening for other viral diseases: Secondary | ICD-10-CM | POA: Diagnosis not present

## 2020-06-09 DIAGNOSIS — K635 Polyp of colon: Secondary | ICD-10-CM | POA: Diagnosis not present

## 2020-06-09 DIAGNOSIS — K5140 Inflammatory polyps of colon without complications: Secondary | ICD-10-CM | POA: Diagnosis not present

## 2020-06-09 DIAGNOSIS — E785 Hyperlipidemia, unspecified: Secondary | ICD-10-CM | POA: Diagnosis not present

## 2020-06-23 DIAGNOSIS — H43813 Vitreous degeneration, bilateral: Secondary | ICD-10-CM | POA: Diagnosis not present

## 2020-06-23 DIAGNOSIS — H5202 Hypermetropia, left eye: Secondary | ICD-10-CM | POA: Diagnosis not present

## 2020-06-23 DIAGNOSIS — H25813 Combined forms of age-related cataract, bilateral: Secondary | ICD-10-CM | POA: Diagnosis not present

## 2020-06-23 DIAGNOSIS — E119 Type 2 diabetes mellitus without complications: Secondary | ICD-10-CM | POA: Diagnosis not present

## 2020-08-03 DIAGNOSIS — K219 Gastro-esophageal reflux disease without esophagitis: Secondary | ICD-10-CM | POA: Diagnosis not present

## 2020-08-03 DIAGNOSIS — E785 Hyperlipidemia, unspecified: Secondary | ICD-10-CM | POA: Diagnosis not present

## 2020-08-03 DIAGNOSIS — E1169 Type 2 diabetes mellitus with other specified complication: Secondary | ICD-10-CM | POA: Diagnosis not present

## 2020-08-03 DIAGNOSIS — I10 Essential (primary) hypertension: Secondary | ICD-10-CM | POA: Diagnosis not present

## 2020-08-04 ENCOUNTER — Encounter: Payer: Self-pay | Admitting: Family Medicine

## 2020-08-04 ENCOUNTER — Other Ambulatory Visit: Payer: Self-pay

## 2020-08-04 ENCOUNTER — Ambulatory Visit (INDEPENDENT_AMBULATORY_CARE_PROVIDER_SITE_OTHER): Payer: Medicare Other | Admitting: Family Medicine

## 2020-08-04 VITALS — BP 132/50 | HR 71 | Ht 62.0 in | Wt 199.0 lb

## 2020-08-04 DIAGNOSIS — K635 Polyp of colon: Secondary | ICD-10-CM

## 2020-08-04 DIAGNOSIS — I10 Essential (primary) hypertension: Secondary | ICD-10-CM | POA: Diagnosis not present

## 2020-08-04 DIAGNOSIS — R7301 Impaired fasting glucose: Secondary | ICD-10-CM | POA: Diagnosis not present

## 2020-08-04 DIAGNOSIS — E1165 Type 2 diabetes mellitus with hyperglycemia: Secondary | ICD-10-CM | POA: Insufficient documentation

## 2020-08-04 DIAGNOSIS — M1990 Unspecified osteoarthritis, unspecified site: Secondary | ICD-10-CM | POA: Insufficient documentation

## 2020-08-04 DIAGNOSIS — Z1231 Encounter for screening mammogram for malignant neoplasm of breast: Secondary | ICD-10-CM | POA: Diagnosis not present

## 2020-08-04 DIAGNOSIS — E1169 Type 2 diabetes mellitus with other specified complication: Secondary | ICD-10-CM | POA: Insufficient documentation

## 2020-08-04 NOTE — Progress Notes (Signed)
New Patient Office Visit  Subjective:  Patient ID: Pam Porter, female    DOB: November 19, 1945  Age: 75 y.o. MRN: [id number]  CC:  Chief Complaint  Patient presents with  . Establish Care    HPI Pam Porter presents to estab care.  She is coming from [place].  Her provider recently retired.  Actually took care of her mom for years and so she is transferring care.  She brought in her additional information.  She does have advanced directives including a DNR living will and healthcare power of attorney.  So hopefully be able to get copies of those.  She has had a colonoscopy in April 2022 she is on a 3-year recall.  Last mammogram was in July 2021.  [facility].  She does not get the flu vaccine yearly but is up-to-date on her tetanus and her pneumonia vaccines.  She has had 1 shingles vaccine.  She has a history of diabetes and has worked hard to eat a low-carb diet in fact she has lost about 5 pounds fairly recently as she is not currently on medication but is trying to control it with diet her last A1c was 6.0 on June 09, 2020.  She was given a prescription for statin but has been hesitant to start it and wanted to discuss that today.  Hypertension- Pt denies chest pain, SOB, dizziness, or heart palpitations.  Taking meds as directed w/o problems.  Denies medication side effects.      Past Medical History:  Diagnosis Date  . Arthritis   . GERD (gastroesophageal reflux disease)   . Hypertension   . Skin cancer    basal cell    Past Surgical History:  Procedure Laterality Date  . LUMBAR LAMINECTOMY/DECOMPRESSION MICRODISCECTOMY Bilateral 03/11/2019   Procedure: LAMINECTOMY AND FORAMINOTOMY BILATERAL LUMBAR THREE- LUMBAR FOUR, LUMBAR FOUR- LUMBAR FIVE;  Surgeon: Newman Pies, MD;  Location: [facility];  Service: Neurosurgery;  Laterality: Bilateral;  LAMINECTOMY AND FORAMINOTOMY BILATERAL LUMBAR THREE- LUMBAR  FOUR, LUMBAR FOUR- LUMBAR FIVE  . TOTAL ABDOMINAL HYSTERECTOMY      Family History  Problem Relation Age of Onset  . Diabetes Mother   . Hypertension Mother     Social History   Socioeconomic History  . Marital status: Married    Spouse name: Not on file  . Number of children: Not on file  . Years of education: Not on file  . Highest education level: Not on file  Occupational History  . Not on file  Tobacco Use  . Smoking status: Never Smoker  . Smokeless tobacco: Never Used  Vaping Use  . Vaping Use: Never used  Substance and Sexual Activity  . Alcohol use: Never  . Drug use: Never  . Sexual activity: Not Currently  Other Topics Concern  . Not on file  Social History Narrative  . Not on file   Social Determinants of Health   Financial Resource Strain: Not on file  Food Insecurity: Not on file  Transportation Needs: Not on file  Physical Activity: Not on file  Stress: Not on file  Social Connections: Not on file  Intimate Partner Violence: Not on file    ROS Review of Systems  Constitutional: Negative for diaphoresis, fever and unexpected weight change.  HENT: Negative for hearing loss, postnasal drip, sneezing and tinnitus.   Eyes: Negative for visual disturbance.  Respiratory: Negative for cough and wheezing.   Cardiovascular: Negative  for chest pain and palpitations.  Genitourinary: Negative for vaginal bleeding and vaginal discharge.  Musculoskeletal: Negative for arthralgias.  Neurological: Negative for headaches.  Hematological: Negative for adenopathy. Does not bruise/bleed easily.    Objective:   Today's Vitals: BP (!) 132/50   Pulse 71   Ht 5\' 2"  (1.575 m)   Wt 199 lb (90.3 kg)   SpO2 96%   BMI 36.40 kg/m   Physical Exam Constitutional:      Appearance: She is well-developed.  HENT:     Head: Normocephalic and atraumatic.  Cardiovascular:     Rate and Rhythm: Normal rate and regular rhythm.     Heart sounds: Normal heart sounds.   Pulmonary:     Effort: Pulmonary effort is normal.     Breath sounds: Normal breath sounds.  Skin:    General: Skin is warm and dry.  Neurological:     Mental Status: She is alert and oriented to person, place, and time.  Psychiatric:        Behavior: Behavior normal.     Assessment & Plan:   Problem List Items Addressed This Visit      Cardiovascular and Mediastinum   Essential hypertension    Pressure okay today the systolics just a little bit borderline will monitor carefully to try to keep systolic under 672 if possible but definitely under 140.  Continue current regimen.  She reports she is up-to-date on her blood work and had that done recently in January so she should not be due until July or August.      Relevant Medications   losartan (COZAAR) 50 MG tablet   amLODipine (NORVASC) 2.5 MG tablet     Digestive   Colon polyps     Endocrine   Impaired fasting glucose    Clear for current diagnosis is diabetes or prediabetes I saw 2 different ways that it was documented in her chart.  Her current A1c is technically in the prediabetes range and she is not currently on prescription medication so for now we will label as impaired fasting glucose and monitor carefully.  Plan to recheck A1c in about 4 months.  Did encourage her to start the statin.  Call if any problems side effects or concerns.       Other Visit Diagnoses    Screening mammogram for breast cancer    -  Primary   Relevant Orders   MM 3D SCREEN BREAST BILATERAL      Her mammogram this summer.  Order placed.  Outpatient Encounter Medications as of 08/04/2020  Medication Sig  . amLODipine (NORVASC) 2.5 MG tablet Take 2.5 mg by mouth every morning.  . cholecalciferol (VITAMIN D) 25 MCG (1000 UNIT) tablet Take 1,000 mcg by mouth daily.  Marland Kitchen docusate sodium (COLACE) 100 MG capsule every evening.  . Flaxseed, Linseed, (FLAX SEED OIL) 1000 MG CAPS every morning.  . hydrochlorothiazide (HYDRODIURIL) 12.5 MG tablet  Take 12.5 mg by mouth daily.   . hydrOXYzine (ATARAX/VISTARIL) 25 MG tablet Take 25 mg by mouth daily.   Marland Kitchen losartan (COZAAR) 50 MG tablet Take 1 tablet by mouth 2 (two) times daily.  . Magnesium 500 MG TABS Take 500 mg by mouth at bedtime.   . Turmeric Curcumin 500 MG CAPS Take 1,000 mg by mouth 2 (two) times daily at 10 AM and 5 PM.  . [DISCONTINUED] cholecalciferol (VITAMIN D3) 25 MCG (1000 UT) tablet Take 1,000 Units by mouth daily.  . [DISCONTINUED] cyclobenzaprine (FLEXERIL) 10 MG  tablet Take 1 tablet (10 mg total) by mouth 3 (three) times daily as needed for muscle spasms.  . [DISCONTINUED] docusate sodium (COLACE) 100 MG capsule Take 1 capsule (100 mg total) by mouth 2 (two) times daily. (Patient taking differently: Take 100 mg by mouth as needed.)  . [DISCONTINUED] Flaxseed, Linseed, (FLAX SEED OIL) 1000 MG CAPS Take 1,000 mg by mouth daily.   . [DISCONTINUED] oxyCODONE-acetaminophen (PERCOCET/ROXICET) 5-325 MG tablet Take 1-2 tablets by mouth every 4 (four) hours as needed for moderate pain.  . [DISCONTINUED] ramipril (ALTACE) 10 MG capsule Take 10 mg by mouth 2 (two) times daily.   . [DISCONTINUED] RED YEAST RICE EXTRACT PO Take 1 capsule by mouth daily. Take 600 mg daily   No facility-administered encounter medications on file as of 08/04/2020.    Follow-up: Return in about 3 months (around 11/04/2020).   Beatrice Lecher, MD

## 2020-08-05 ENCOUNTER — Encounter: Payer: Self-pay | Admitting: Family Medicine

## 2020-08-05 ENCOUNTER — Telehealth: Payer: Self-pay | Admitting: Family Medicine

## 2020-08-05 DIAGNOSIS — K635 Polyp of colon: Secondary | ICD-10-CM | POA: Insufficient documentation

## 2020-08-05 NOTE — Telephone Encounter (Signed)
Please contact [facility] for colonoscopy from 2022.  Also please contact [facility] for most recent Pap smear and mammogram report.  Be taking over any GYN care.  List of vaccinations placed in basket to be abstracted.

## 2020-08-05 NOTE — Assessment & Plan Note (Signed)
Pressure okay today the systolics just a little bit borderline will monitor carefully to try to keep systolic under 068 if possible but definitely under 140.  Continue current regimen.  She reports she is up-to-date on her blood work and had that done recently in January so she should not be due until July or August.

## 2020-08-05 NOTE — Assessment & Plan Note (Signed)
Clear for current diagnosis is diabetes or prediabetes I saw 2 different ways that it was documented in her chart.  Her current A1c is technically in the prediabetes range and she is not currently on prescription medication so for now we will label as impaired fasting glucose and monitor carefully.  Plan to recheck A1c in about 4 months.  Did encourage her to start the statin.  Call if any problems side effects or concerns.

## 2020-08-07 NOTE — Telephone Encounter (Signed)
Sent fax requesting results

## 2020-08-14 ENCOUNTER — Telehealth: Payer: Self-pay | Admitting: Family Medicine

## 2020-08-14 NOTE — Telephone Encounter (Signed)
I called pt to let her know we need her to come fill out a Medical Record Request so we can get her Colonoscopy Report from [facility] and then it will need to be faxed to their office at [phone] and their Ph is [phone]. Patient states she may have a copy of the report and will bring it by the office to give to  [place]

## 2020-08-18 ENCOUNTER — Telehealth: Payer: Self-pay | Admitting: Family Medicine

## 2020-08-18 DIAGNOSIS — R7301 Impaired fasting glucose: Secondary | ICD-10-CM

## 2020-08-18 DIAGNOSIS — I10 Essential (primary) hypertension: Secondary | ICD-10-CM

## 2020-08-18 NOTE — Telephone Encounter (Signed)
Pt is requesting a lab order to be placed for her labs so she can come in a week before her September appt with Dr.Metheney

## 2020-08-23 NOTE — Telephone Encounter (Signed)
Orders Placed This Encounter  Procedures   CBC   COMPLETE METABOLIC PANEL WITH GFR   Lipid panel    Order Specific Question:   Has the patient fasted?    Answer:   Yes   Hemoglobin A1c

## 2020-08-24 NOTE — Telephone Encounter (Signed)
Pt advised.

## 2020-11-04 ENCOUNTER — Ambulatory Visit (INDEPENDENT_AMBULATORY_CARE_PROVIDER_SITE_OTHER): Payer: Medicare Other

## 2020-11-04 ENCOUNTER — Other Ambulatory Visit: Payer: Self-pay

## 2020-11-04 DIAGNOSIS — R7301 Impaired fasting glucose: Secondary | ICD-10-CM | POA: Diagnosis not present

## 2020-11-04 DIAGNOSIS — I10 Essential (primary) hypertension: Secondary | ICD-10-CM | POA: Diagnosis not present

## 2020-11-04 DIAGNOSIS — Z1231 Encounter for screening mammogram for malignant neoplasm of breast: Secondary | ICD-10-CM | POA: Diagnosis not present

## 2020-11-05 LAB — CBC
HCT: 45.7 % — ABNORMAL HIGH (ref 35.0–45.0)
Hemoglobin: 15.5 g/dL (ref 11.7–15.5)
MCH: 29.8 pg (ref 27.0–33.0)
MCHC: 33.9 g/dL (ref 32.0–36.0)
MCV: 87.9 fL (ref 80.0–100.0)
MPV: 9.6 fL (ref 7.5–12.5)
Platelets: 274 10*3/uL (ref 140–400)
RBC: 5.2 10*6/uL — ABNORMAL HIGH (ref 3.80–5.10)
RDW: 12.7 % (ref 11.0–15.0)
WBC: 5.5 10*3/uL (ref 3.8–10.8)

## 2020-11-05 LAB — COMPLETE METABOLIC PANEL WITH GFR
AG Ratio: 2.1 (calc) (ref 1.0–2.5)
ALT: 24 U/L (ref 6–29)
AST: 19 U/L (ref 10–35)
Albumin: 4.1 g/dL (ref 3.6–5.1)
Alkaline phosphatase (APISO): 58 U/L (ref 37–153)
BUN: 14 mg/dL (ref 7–25)
CO2: 30 mmol/L (ref 20–32)
Calcium: 9.2 mg/dL (ref 8.6–10.4)
Chloride: 102 mmol/L (ref 98–110)
Creat: 0.77 mg/dL (ref 0.60–1.00)
Globulin: 2 g/dL (calc) (ref 1.9–3.7)
Glucose, Bld: 114 mg/dL — ABNORMAL HIGH (ref 65–99)
Potassium: 3.8 mmol/L (ref 3.5–5.3)
Sodium: 141 mmol/L (ref 135–146)
Total Bilirubin: 0.7 mg/dL (ref 0.2–1.2)
Total Protein: 6.1 g/dL (ref 6.1–8.1)
eGFR: 82 mL/min/{1.73_m2} (ref >=60)

## 2020-11-05 LAB — LIPID PANEL
Cholesterol: 173 mg/dL (ref <200)
HDL: 56 mg/dL (ref >=50)
LDL Cholesterol (Calc): 96 mg/dL (calc)
Non-HDL Cholesterol (Calc): 117 mg/dL (calc) (ref <130)
Total CHOL/HDL Ratio: 3.1 (calc) (ref <5.0)
Triglycerides: 116 mg/dL (ref <150)

## 2020-11-05 LAB — HEMOGLOBIN A1C
Hgb A1c MFr Bld: 6 % of total Hgb — ABNORMAL HIGH (ref <5.7)
Mean Plasma Glucose: 126 mg/dL
eAG (mmol/L): 7 mmol/L

## 2020-11-05 NOTE — Progress Notes (Signed)
Hi Calia, your mammogram showed a questionable area in your right breast.  The imaging department will be contacting you to schedule you for further work-up including a diagnostic mammogram and possible ultrasound.

## 2020-11-05 NOTE — Progress Notes (Signed)
Hi Pam Porter, hemoglobin looks good metabolic panel is normal.  Cholesterol also looks good.  A1c is in the prediabetes range.  We can go over the numbers in further detail when I see you next week.  I would encourage you to consider getting your shingles vaccine updated at the pharmacy if you have not done so.  If you have had it then please let us know and we will get your chart updated as well.

## 2020-11-06 ENCOUNTER — Other Ambulatory Visit: Payer: Self-pay | Admitting: Family Medicine

## 2020-11-06 DIAGNOSIS — R928 Other abnormal and inconclusive findings on diagnostic imaging of breast: Secondary | ICD-10-CM

## 2020-11-09 ENCOUNTER — Ambulatory Visit (INDEPENDENT_AMBULATORY_CARE_PROVIDER_SITE_OTHER): Payer: Medicare Other | Admitting: Family Medicine

## 2020-11-09 ENCOUNTER — Encounter: Payer: Self-pay | Admitting: Family Medicine

## 2020-11-09 VITALS — BP 124/47 | HR 79 | Ht 62.0 in | Wt 200.0 lb

## 2020-11-09 DIAGNOSIS — R7301 Impaired fasting glucose: Secondary | ICD-10-CM | POA: Diagnosis not present

## 2020-11-09 DIAGNOSIS — I10 Essential (primary) hypertension: Secondary | ICD-10-CM

## 2020-11-09 DIAGNOSIS — Z6836 Body mass index (BMI) 36.0-36.9, adult: Secondary | ICD-10-CM | POA: Diagnosis not present

## 2020-11-09 NOTE — Patient Instructions (Signed)
Recommend using "my fitness pal" or "lose it" smart phone apps to help you track calorie counts.

## 2020-11-09 NOTE — Assessment & Plan Note (Addendum)
Well controlled.  She would really like to be able to reduce her medication her amlodipine was started after her surgery because her blood pressure started going back up.  We discussed the importance of working on a low-salt diet and also getting back into her exercise routine which she has not done since her surgery.  I think if she was able to do this she could probably come off of the 2.5 mg of amlodipine.  She is can hold the pill for couple weeks and monitor her pressures especially the diastolic which is particularly low today.  Also make sure drinking plenty of fluid.  Follow up in  6 mo

## 2020-11-09 NOTE — Progress Notes (Signed)
Established Patient Office Visit  Subjective:  Patient ID: Pam Porter, female    DOB: 08-09-1945  Age: 75 y.o. MRN: [id number]  CC:  Chief Complaint  Patient presents with   Hypertension   ifg    A1c=6.0% on 11/04/2020     HPI Pam Porter Date presents for   Hypertension- Pt denies chest pain, SOB, dizziness, or heart palpitations.  Taking meds as directed w/o problems.  Denies medication side effects.    Impaired fasting glucose-no increased thirst or urination. No symptoms consistent with hypoglycemia.  She is frustrated with not losing weight.  He she has really tried hard to cut back on sugars and carbs and said initially lost about 5 pounds but has not been able to get the scale to move ever since then its been stable for at least the last 3 to 4 months with no change.  He also has not started exercising again.  Past Medical History:  Diagnosis Date   Arthritis    GERD (gastroesophageal reflux disease)    Hypertension    Skin cancer    basal cell    Past Surgical History:  Procedure Laterality Date   LUMBAR LAMINECTOMY/DECOMPRESSION MICRODISCECTOMY Bilateral 03/11/2019   Procedure: LAMINECTOMY AND FORAMINOTOMY BILATERAL LUMBAR THREE- LUMBAR FOUR, LUMBAR FOUR- LUMBAR FIVE;  Surgeon: Pam Pies, MD;  Location: [facility];  Service: Neurosurgery;  Laterality: Bilateral;  LAMINECTOMY AND FORAMINOTOMY BILATERAL LUMBAR THREE- LUMBAR FOUR, LUMBAR FOUR- LUMBAR FIVE   TOTAL ABDOMINAL HYSTERECTOMY      Family History  Problem Relation Age of Onset   Diabetes Mother    Hypertension Mother     Social History   Socioeconomic History   Marital status: Married    Spouse name: Not on file   Number of children: Not on file   Years of education: Not on file   Highest education level: Not on file  Occupational History   Not on file  Tobacco Use   Smoking status: Never   Smokeless tobacco: Never  Vaping Use   Vaping Use: Never used  Substance and Sexual  Activity   Alcohol use: Never   Drug use: Never   Sexual activity: Not Currently  Other Topics Concern   Not on file  Social History Narrative   Not on file   Social Determinants of Health   Financial Resource Strain: Not on file  Food Insecurity: Not on file  Transportation Needs: Not on file  Physical Activity: Not on file  Stress: Not on file  Social Connections: Not on file  Intimate Partner Violence: Not on file    Outpatient Medications Prior to Visit  Medication Sig Dispense Refill   losartan (COZAAR) 100 MG tablet Take 50 mg by mouth 2 (two) times daily.     amLODipine (NORVASC) 2.5 MG tablet Take 2.5 mg by mouth every morning.     cholecalciferol (VITAMIN D) 25 MCG (1000 UNIT) tablet Take 1,000 mcg by mouth daily.     docusate sodium (COLACE) 100 MG capsule every evening.     Flaxseed, Linseed, (FLAX SEED OIL) 1000 MG CAPS every morning.     hydrochlorothiazide (HYDRODIURIL) 12.5 MG tablet Take 12.5 mg by mouth daily.      hydrOXYzine (ATARAX/VISTARIL) 25 MG tablet Take 25 mg by mouth daily.      Magnesium 500 MG TABS Take 500 mg by mouth at bedtime.      Turmeric Curcumin 500 MG CAPS Take 1,000 mg by mouth 2 (two) times  daily at 10 AM and 5 PM.     losartan (COZAAR) 50 MG tablet Take 1 tablet by mouth 2 (two) times daily.     No facility-administered medications prior to visit.    Allergies  Allergen Reactions   Amoxicillin     Other reaction(s): nausea   Amoxapine And Related Nausea Only    ROS Review of Systems    Objective:    Physical Exam Constitutional:      Appearance: Normal appearance. She is well-developed.  HENT:     Head: Normocephalic and atraumatic.  Cardiovascular:     Rate and Rhythm: Normal rate and regular rhythm.     Heart sounds: Normal heart sounds.  Pulmonary:     Effort: Pulmonary effort is normal.     Breath sounds: Normal breath sounds.  Skin:    General: Skin is warm and dry.  Neurological:     Mental Status: She is  alert and oriented to person, place, and time.  Psychiatric:        Behavior: Behavior normal.    BP (!) 124/47   Pulse 79   Ht '5\' 2"'  (1.575 m)   Wt 200 lb (90.7 kg)   SpO2 98%   BMI 36.58 kg/m  Wt Readings from Last 3 Encounters:  11/09/20 200 lb (90.7 kg)  08/04/20 199 lb (90.3 kg)  03/11/19 198 lb 5 oz (90 kg)     Health Maintenance Due  Topic Date Due   DEXA SCAN  Never done   COVID-19 Vaccine (3 - Pfizer risk series) 12/17/2019    There are no preventive care reminders to display for this patient.  No results found for: TSH Lab Results  Component Value Date   WBC 5.5 11/04/2020   HGB 15.5 11/04/2020   HCT 45.7 (H) 11/04/2020   MCV 87.9 11/04/2020   PLT 274 11/04/2020   Lab Results  Component Value Date   NA 141 11/04/2020   K 3.8 11/04/2020   CO2 30 11/04/2020   GLUCOSE 114 (H) 11/04/2020   BUN 14 11/04/2020   CREATININE 0.77 11/04/2020   BILITOT 0.7 11/04/2020   AST 19 11/04/2020   ALT 24 11/04/2020   PROT 6.1 11/04/2020   CALCIUM 9.2 11/04/2020   ANIONGAP 8 03/07/2019   EGFR 82 11/04/2020   Lab Results  Component Value Date   CHOL 173 11/04/2020   Lab Results  Component Value Date   HDL 56 11/04/2020   Lab Results  Component Value Date   LDLCALC 96 11/04/2020   Lab Results  Component Value Date   TRIG 116 11/04/2020   Lab Results  Component Value Date   CHOLHDL 3.1 11/04/2020   Lab Results  Component Value Date   HGBA1C 6.0 (H) 11/04/2020      Assessment & Plan:   Problem List Items Addressed This Visit       Cardiovascular and Mediastinum   Essential hypertension - Primary    Well controlled.  She would really like to be able to reduce her medication her amlodipine was started after her surgery because her blood pressure started going back up.  We discussed the importance of working on a low-salt diet and also getting back into her exercise routine which she has not done since her surgery.  I think if she was able to do this  she could probably come off of the 2.5 mg of amlodipine.  She is can hold the pill for couple weeks and monitor her  pressures especially the diastolic which is particularly low today.  Also make sure drinking plenty of fluid.  Follow up in  6 mo       Relevant Medications   losartan (COZAAR) 100 MG tablet     Endocrine   Impaired fasting glucose    A1c 6.0 with which looks great.  She is done a great job and really cutting back on sugars and carbs.  Well controlled. Continue current regimen. Follow up in  7mo      Other Visit Diagnoses     BMI 36.0-36.9,adult           BMI 36-just discussed the importance of getting back into the gym and really cutting back on salt intake.  I also gave her some information on one of the calorie counting apps to help her set some daily caloric goals.  She is already made some great changes.  No orders of the defined types were placed in this encounter.   Follow-up: Return in about 6 months (around 05/09/2021) for Hypertension and Pre-diabetes .    CBeatrice Lecher MD

## 2020-11-09 NOTE — Assessment & Plan Note (Addendum)
A1c 6.0 with which looks great.  She is done a great job and really cutting back on sugars and carbs.  Well controlled. Continue current regimen. Follow up in  81mo

## 2020-11-24 ENCOUNTER — Ambulatory Visit
Admission: RE | Admit: 2020-11-24 | Discharge: 2020-11-24 | Disposition: A | Discharge status: Home / Self Care | Payer: Medicare Other | Source: Ambulatory Visit | Attending: Family Medicine | Admitting: Family Medicine

## 2020-11-24 ENCOUNTER — Other Ambulatory Visit: Payer: Self-pay

## 2020-11-24 ENCOUNTER — Other Ambulatory Visit: Payer: Self-pay | Admitting: Family Medicine

## 2020-11-24 DIAGNOSIS — R928 Other abnormal and inconclusive findings on diagnostic imaging of breast: Secondary | ICD-10-CM

## 2020-11-24 DIAGNOSIS — R922 Inconclusive mammogram: Secondary | ICD-10-CM | POA: Diagnosis not present

## 2020-12-09 ENCOUNTER — Ambulatory Visit
Admission: RE | Admit: 2020-12-09 | Discharge: 2020-12-09 | Disposition: A | Discharge status: Home / Self Care | Payer: Medicare Other | Source: Ambulatory Visit | Attending: Family Medicine | Admitting: Family Medicine

## 2020-12-09 ENCOUNTER — Other Ambulatory Visit: Payer: Self-pay

## 2020-12-09 ENCOUNTER — Other Ambulatory Visit: Payer: Self-pay | Admitting: Family Medicine

## 2020-12-09 DIAGNOSIS — N6489 Other specified disorders of breast: Secondary | ICD-10-CM | POA: Diagnosis not present

## 2020-12-09 DIAGNOSIS — R928 Other abnormal and inconclusive findings on diagnostic imaging of breast: Secondary | ICD-10-CM

## 2020-12-09 DIAGNOSIS — N6315 Unspecified lump in the right breast, overlapping quadrants: Secondary | ICD-10-CM | POA: Diagnosis not present

## 2020-12-21 ENCOUNTER — Other Ambulatory Visit: Payer: Self-pay | Admitting: Family Medicine

## 2020-12-21 ENCOUNTER — Ambulatory Visit: Payer: Self-pay | Admitting: Surgery

## 2020-12-21 ENCOUNTER — Encounter: Payer: Self-pay | Admitting: Family Medicine

## 2020-12-21 DIAGNOSIS — E785 Hyperlipidemia, unspecified: Secondary | ICD-10-CM

## 2020-12-21 DIAGNOSIS — D241 Benign neoplasm of right breast: Secondary | ICD-10-CM | POA: Diagnosis not present

## 2020-12-21 MED ORDER — HYDROCHLOROTHIAZIDE 12.5 MG PO TABS: 12.5000 mg | ORAL_TABLET | Freq: Every day | ORAL | 1 refills | Status: DC

## 2020-12-21 MED ORDER — LOSARTAN POTASSIUM 100 MG PO TABS: 50.0000 mg | ORAL_TABLET | Freq: Two times a day (BID) | ORAL | 1 refills | Status: DC

## 2020-12-21 MED ORDER — HYDROXYZINE HCL 25 MG PO TABS: 25.0000 mg | ORAL_TABLET | Freq: Every day | ORAL | 1 refills | Status: DC

## 2020-12-21 NOTE — Telephone Encounter (Signed)
We have not prescribed these medications for the patient previously.  Please review and refill if appropriate.  T. Alif Petrak, CMA  

## 2020-12-22 ENCOUNTER — Other Ambulatory Visit: Payer: Self-pay | Admitting: Surgery

## 2020-12-22 DIAGNOSIS — D241 Benign neoplasm of right breast: Secondary | ICD-10-CM

## 2020-12-22 NOTE — Telephone Encounter (Signed)
[facility] pharmacy requesting med refill for lovastatin. Rx not listed in active med list.

## 2021-01-18 ENCOUNTER — Encounter (HOSPITAL_BASED_OUTPATIENT_CLINIC_OR_DEPARTMENT_OTHER): Payer: Self-pay | Admitting: Surgery

## 2021-01-18 ENCOUNTER — Other Ambulatory Visit: Payer: Self-pay

## 2021-01-20 ENCOUNTER — Encounter (HOSPITAL_BASED_OUTPATIENT_CLINIC_OR_DEPARTMENT_OTHER)
Admission: RE | Admit: 2021-01-20 | Discharge: 2021-01-20 | Disposition: A | Discharge status: Home / Self Care | Payer: Medicare Other | Source: Ambulatory Visit | Attending: Surgery | Admitting: Surgery

## 2021-01-20 ENCOUNTER — Other Ambulatory Visit: Payer: Self-pay

## 2021-01-20 DIAGNOSIS — I10 Essential (primary) hypertension: Secondary | ICD-10-CM | POA: Diagnosis not present

## 2021-01-20 DIAGNOSIS — Z01818 Encounter for other preprocedural examination: Secondary | ICD-10-CM | POA: Diagnosis not present

## 2021-01-20 LAB — BASIC METABOLIC PANEL
Anion gap: 6 (ref 5–15)
BUN: 13 mg/dL (ref 8–23)
CO2: 30 mmol/L (ref 22–32)
Calcium: 9.2 mg/dL (ref 8.9–10.3)
Chloride: 102 mmol/L (ref 98–111)
Creatinine, Ser: 0.75 mg/dL (ref 0.44–1.00)
GFR, Estimated: >60 mL/min (ref >60)
Glucose, Bld: 124 mg/dL — ABNORMAL HIGH (ref 70–99)
Potassium: 4.5 mmol/L (ref 3.5–5.1)
Sodium: 138 mmol/L (ref 135–145)

## 2021-01-20 MED ORDER — CHLORHEXIDINE GLUCONATE CLOTH 2 % EX PADS: 6.0000 | MEDICATED_PAD | Freq: Once | CUTANEOUS | Status: DC

## 2021-01-20 NOTE — Progress Notes (Signed)

## 2021-01-26 ENCOUNTER — Ambulatory Visit
Admission: RE | Admit: 2021-01-26 | Discharge: 2021-01-26 | Disposition: A | Discharge status: Home / Self Care | Payer: Medicare Other | Source: Ambulatory Visit | Attending: Surgery | Admitting: Surgery

## 2021-01-26 DIAGNOSIS — R928 Other abnormal and inconclusive findings on diagnostic imaging of breast: Secondary | ICD-10-CM | POA: Diagnosis not present

## 2021-01-26 DIAGNOSIS — D241 Benign neoplasm of right breast: Secondary | ICD-10-CM

## 2021-01-26 NOTE — Anesthesia Preprocedure Evaluation (Addendum)
Anesthesia Evaluation  Patient identified by MRN, date of birth, ID band Patient awake    Reviewed: Allergy & Precautions, NPO status , Patient's Chart, lab work & pertinent test results  History of Anesthesia Complications Negative for: history of anesthetic complications  Airway Mallampati: II  TM Distance: >3 FB Neck ROM: Full    Dental  (+) Dental Advisory Given, Teeth Intact   Pulmonary neg pulmonary ROS,    breath sounds clear to auscultation       Cardiovascular hypertension, Pt. on medications (-) angina Rhythm:Regular Rate:Normal     Neuro/Psych Back pain    GI/Hepatic Neg liver ROS, GERD  Controlled,  Endo/Other  obese  Renal/GU negative Renal ROS     Musculoskeletal  (+) Arthritis ,   Abdominal (+) + obese,   Peds  Hematology negative hematology ROS (+)   Anesthesia Other Findings   Reproductive/Obstetrics                            Anesthesia Physical Anesthesia Plan  ASA: 2  Anesthesia Plan: General   Post-op Pain Management: Tylenol PO (pre-op), Toradol IV (intra-op) and Minimal or no pain anticipated   Induction: Intravenous  PONV Risk Score and Plan: 3 and Ondansetron, Dexamethasone and Treatment may vary due to age or medical condition  Airway Management Planned: LMA  Additional Equipment: None  Intra-op Plan:   Post-operative Plan:   Informed Consent: I have reviewed the patients History and Physical, chart, labs and discussed the procedure including the risks, benefits and alternatives for the proposed anesthesia with the patient or authorized representative who has indicated his/her understanding and acceptance.     Dental advisory given  Plan Discussed with: CRNA and Surgeon  Anesthesia Plan Comments:        Anesthesia Quick Evaluation

## 2021-01-27 ENCOUNTER — Encounter (HOSPITAL_BASED_OUTPATIENT_CLINIC_OR_DEPARTMENT_OTHER): Payer: Self-pay | Admitting: Surgery

## 2021-01-27 ENCOUNTER — Ambulatory Visit (HOSPITAL_BASED_OUTPATIENT_CLINIC_OR_DEPARTMENT_OTHER)
Admission: RE | Admit: 2021-01-27 | Discharge: 2021-01-27 | Disposition: A | Discharge status: Home / Self Care | Payer: Medicare Other | Attending: Surgery | Admitting: Surgery

## 2021-01-27 ENCOUNTER — Ambulatory Visit
Admission: RE | Admit: 2021-01-27 | Discharge: 2021-01-27 | Disposition: A | Discharge status: Home / Self Care | Payer: Medicare Other | Source: Ambulatory Visit | Attending: Surgery | Admitting: Surgery

## 2021-01-27 ENCOUNTER — Ambulatory Visit (HOSPITAL_BASED_OUTPATIENT_CLINIC_OR_DEPARTMENT_OTHER): Payer: Medicare Other | Admitting: Anesthesiology

## 2021-01-27 ENCOUNTER — Encounter (HOSPITAL_BASED_OUTPATIENT_CLINIC_OR_DEPARTMENT_OTHER)
Admission: RE | Disposition: A | Discharge status: Home / Self Care | Payer: Self-pay | Source: Home / Self Care | Attending: Surgery

## 2021-01-27 ENCOUNTER — Other Ambulatory Visit: Payer: Self-pay

## 2021-01-27 DIAGNOSIS — D241 Benign neoplasm of right breast: Secondary | ICD-10-CM

## 2021-01-27 DIAGNOSIS — E669 Obesity, unspecified: Secondary | ICD-10-CM | POA: Insufficient documentation

## 2021-01-27 DIAGNOSIS — R928 Other abnormal and inconclusive findings on diagnostic imaging of breast: Secondary | ICD-10-CM | POA: Diagnosis not present

## 2021-01-27 DIAGNOSIS — I10 Essential (primary) hypertension: Secondary | ICD-10-CM

## 2021-01-27 DIAGNOSIS — N6082 Other benign mammary dysplasias of left breast: Secondary | ICD-10-CM | POA: Diagnosis not present

## 2021-01-27 DIAGNOSIS — N6489 Other specified disorders of breast: Secondary | ICD-10-CM | POA: Diagnosis present

## 2021-01-27 DIAGNOSIS — N6091 Unspecified benign mammary dysplasia of right breast: Secondary | ICD-10-CM | POA: Diagnosis not present

## 2021-01-27 DIAGNOSIS — N6081 Other benign mammary dysplasias of right breast: Secondary | ICD-10-CM | POA: Diagnosis not present

## 2021-01-27 DIAGNOSIS — Z6836 Body mass index (BMI) 36.0-36.9, adult: Secondary | ICD-10-CM | POA: Insufficient documentation

## 2021-01-27 HISTORY — PX: BREAST LUMPECTOMY WITH RADIOACTIVE SEED LOCALIZATION: SHX6424

## 2021-01-27 HISTORY — PX: BREAST EXCISIONAL BIOPSY: SUR124

## 2021-01-27 SURGERY — BREAST LUMPECTOMY WITH RADIOACTIVE SEED LOCALIZATION
Anesthesia: General | Site: Breast | Laterality: Right

## 2021-01-27 MED ORDER — HYDROCODONE-ACETAMINOPHEN 5-325 MG PO TABS: 1.0000 | ORAL_TABLET | Freq: Four times a day (QID) | ORAL | 0 refills | Status: DC | PRN

## 2021-01-27 MED ORDER — DEXAMETHASONE SODIUM PHOSPHATE 10 MG/ML IJ SOLN
INTRAMUSCULAR | Status: AC
  Filled 2021-01-27: qty 1

## 2021-01-27 MED ORDER — FENTANYL CITRATE (PF) 100 MCG/2ML IJ SOLN
INTRAMUSCULAR | Status: DC | PRN
  Administered 2021-01-27: 50 ug via INTRAVENOUS

## 2021-01-27 MED ORDER — KETOROLAC TROMETHAMINE 30 MG/ML IJ SOLN
INTRAMUSCULAR | Status: AC
  Filled 2021-01-27: qty 1

## 2021-01-27 MED ORDER — ACETAMINOPHEN 500 MG PO TABS: 1000.0000 mg | ORAL_TABLET | ORAL | Status: DC

## 2021-01-27 MED ORDER — BUPIVACAINE-EPINEPHRINE (PF) 0.25% -1:200000 IJ SOLN
INTRAMUSCULAR | Status: AC
  Filled 2021-01-27: qty 60

## 2021-01-27 MED ORDER — ONDANSETRON HCL 4 MG/2ML IJ SOLN
INTRAMUSCULAR | Status: AC
  Filled 2021-01-27: qty 2

## 2021-01-27 MED ORDER — PROMETHAZINE HCL 25 MG/ML IJ SOLN: 6.2500 mg | INTRAMUSCULAR | Status: DC | PRN

## 2021-01-27 MED ORDER — PROPOFOL 10 MG/ML IV BOLUS
INTRAVENOUS | Status: DC | PRN
  Administered 2021-01-27: 200 mg via INTRAVENOUS

## 2021-01-27 MED ORDER — MIDAZOLAM HCL 2 MG/2ML IJ SOLN: 0.5000 mg | Freq: Once | INTRAMUSCULAR | Status: DC | PRN

## 2021-01-27 MED ORDER — VANCOMYCIN HCL 500 MG IV SOLR
INTRAVENOUS | Status: DC | PRN
  Administered 2021-01-27: 500 mg via TOPICAL

## 2021-01-27 MED ORDER — ACETAMINOPHEN 500 MG PO TABS
1000.0000 mg | ORAL_TABLET | Freq: Once | ORAL | Status: AC
  Administered 2021-01-27: 1000 mg via ORAL

## 2021-01-27 MED ORDER — BUPIVACAINE-EPINEPHRINE (PF) 0.25% -1:200000 IJ SOLN
INTRAMUSCULAR | Status: DC | PRN
  Administered 2021-01-27: 20 mL

## 2021-01-27 MED ORDER — EPHEDRINE 5 MG/ML INJ
INTRAVENOUS | Status: AC
  Filled 2021-01-27: qty 5

## 2021-01-27 MED ORDER — DEXAMETHASONE SODIUM PHOSPHATE 10 MG/ML IJ SOLN
INTRAMUSCULAR | Status: DC | PRN
  Administered 2021-01-27: 4 mg via INTRAVENOUS

## 2021-01-27 MED ORDER — VANCOMYCIN HCL 500 MG IV SOLR
INTRAVENOUS | Status: AC
  Filled 2021-01-27: qty 30

## 2021-01-27 MED ORDER — LIDOCAINE HCL (CARDIAC) PF 100 MG/5ML IV SOSY
PREFILLED_SYRINGE | INTRAVENOUS | Status: DC | PRN
  Administered 2021-01-27: 40 mg via INTRATRACHEAL

## 2021-01-27 MED ORDER — LIDOCAINE 2% (20 MG/ML) 5 ML SYRINGE
INTRAMUSCULAR | Status: AC
  Filled 2021-01-27: qty 5

## 2021-01-27 MED ORDER — PROPOFOL 10 MG/ML IV BOLUS
INTRAVENOUS | Status: AC
  Filled 2021-01-27: qty 20

## 2021-01-27 MED ORDER — ACETAMINOPHEN 500 MG PO TABS
ORAL_TABLET | ORAL | Status: AC
  Filled 2021-01-27: qty 2

## 2021-01-27 MED ORDER — FENTANYL CITRATE (PF) 100 MCG/2ML IJ SOLN
INTRAMUSCULAR | Status: AC
  Filled 2021-01-27: qty 2

## 2021-01-27 MED ORDER — KETOROLAC TROMETHAMINE 30 MG/ML IJ SOLN
INTRAMUSCULAR | Status: DC | PRN
  Administered 2021-01-27: 30 mg via INTRAVENOUS

## 2021-01-27 MED ORDER — OXYCODONE HCL 5 MG PO TABS: 5.0000 mg | ORAL_TABLET | Freq: Once | ORAL | Status: DC | PRN

## 2021-01-27 MED ORDER — SODIUM CHLORIDE 0.9 % IV SOLN
INTRAVENOUS | Status: AC
  Filled 2021-01-27 (×3): qty 10

## 2021-01-27 MED ORDER — FENTANYL CITRATE (PF) 100 MCG/2ML IJ SOLN: 25.0000 ug | INTRAMUSCULAR | Status: DC | PRN

## 2021-01-27 MED ORDER — OXYCODONE HCL 5 MG/5ML PO SOLN: 5.0000 mg | Freq: Once | ORAL | Status: DC | PRN

## 2021-01-27 MED ORDER — SODIUM CHLORIDE 0.9 % IV SOLN
INTRAVENOUS | Status: DC | PRN
  Administered 2021-01-27: 500 mL

## 2021-01-27 MED ORDER — MEPERIDINE HCL 25 MG/ML IJ SOLN: 6.2500 mg | INTRAMUSCULAR | Status: DC | PRN

## 2021-01-27 MED ORDER — LACTATED RINGERS IV SOLN: INTRAVENOUS | Status: DC

## 2021-01-27 MED ORDER — CLINDAMYCIN PHOSPHATE 900 MG/50ML IV SOLN
900.0000 mg | INTRAVENOUS | Status: AC
  Administered 2021-01-27: 900 mg via INTRAVENOUS

## 2021-01-27 MED ORDER — ONDANSETRON HCL 4 MG/2ML IJ SOLN
INTRAMUSCULAR | Status: DC | PRN
  Administered 2021-01-27: 4 mg via INTRAVENOUS

## 2021-01-27 MED ORDER — CLINDAMYCIN PHOSPHATE 900 MG/50ML IV SOLN
INTRAVENOUS | Status: AC
  Filled 2021-01-27: qty 50

## 2021-01-27 MED ORDER — EPHEDRINE SULFATE 50 MG/ML IJ SOLN
INTRAMUSCULAR | Status: DC | PRN
  Administered 2021-01-27 (×2): 10 mg via INTRAVENOUS

## 2021-01-27 SURGICAL SUPPLY — 42 items
ADH SKN CLS APL DERMABOND .7 (GAUZE/BANDAGES/DRESSINGS) ×1
APL PRP STRL LF DISP 70% ISPRP (MISCELLANEOUS) ×1
BAG DECANTER FOR FLEXI CONT (MISCELLANEOUS) ×1 IMPLANT
BINDER BREAST XLRG (GAUZE/BANDAGES/DRESSINGS) ×1 IMPLANT
BLADE SURG 15 STRL LF DISP TIS (BLADE) ×1 IMPLANT
BLADE SURG 15 STRL SS (BLADE) ×2
CANISTER SUCT 1200ML W/VALVE (MISCELLANEOUS) ×1 IMPLANT
CHLORAPREP W/TINT 26 (MISCELLANEOUS) ×2 IMPLANT
COVER BACK TABLE 60X90IN (DRAPES) ×2 IMPLANT
COVER MAYO STAND STRL (DRAPES) ×2 IMPLANT
COVER PROBE W GEL 5X96 (DRAPES) ×2 IMPLANT
DERMABOND ADVANCED (GAUZE/BANDAGES/DRESSINGS) ×1
DERMABOND ADVANCED .7 DNX12 (GAUZE/BANDAGES/DRESSINGS) ×1 IMPLANT
DRAPE LAPAROTOMY 100X72 PEDS (DRAPES) ×2 IMPLANT
DRAPE UTILITY XL STRL (DRAPES) ×2 IMPLANT
ELECT COATED BLADE 2.86 ST (ELECTRODE) ×2 IMPLANT
ELECT REM PT RETURN 9FT ADLT (ELECTROSURGICAL) ×2
ELECTRODE REM PT RTRN 9FT ADLT (ELECTROSURGICAL) ×1 IMPLANT
GLOVE SRG 8 PF TXTR STRL LF DI (GLOVE) ×1 IMPLANT
GLOVE SURG LTX SZ8 (GLOVE) ×2 IMPLANT
GLOVE SURG POLYISO LF SZ6.5 (GLOVE) ×1 IMPLANT
GLOVE SURG UNDER POLY LF SZ6.5 (GLOVE) ×1 IMPLANT
GLOVE SURG UNDER POLY LF SZ8 (GLOVE) ×2
GOWN STRL REUS W/ TWL LRG LVL3 (GOWN DISPOSABLE) ×2 IMPLANT
GOWN STRL REUS W/ TWL XL LVL3 (GOWN DISPOSABLE) ×1 IMPLANT
GOWN STRL REUS W/TWL LRG LVL3 (GOWN DISPOSABLE) ×2
GOWN STRL REUS W/TWL XL LVL3 (GOWN DISPOSABLE) ×2
KIT MARKER MARGIN INK (KITS) ×2 IMPLANT
NDL HYPO 25X1 1.5 SAFETY (NEEDLE) ×1 IMPLANT
NEEDLE HYPO 25X1 1.5 SAFETY (NEEDLE) ×2 IMPLANT
NS IRRIG 1000ML POUR BTL (IV SOLUTION) ×2 IMPLANT
PACK BASIN DAY SURGERY FS (CUSTOM PROCEDURE TRAY) ×2 IMPLANT
PENCIL SMOKE EVACUATOR (MISCELLANEOUS) ×2 IMPLANT
SLEEVE SCD COMPRESS KNEE MED (STOCKING) ×2 IMPLANT
SPONGE T-LAP 4X18 ~~LOC~~+RFID (SPONGE) ×2 IMPLANT
SUT MNCRL AB 4-0 PS2 18 (SUTURE) ×2 IMPLANT
SUT VICRYL 3-0 CR8 SH (SUTURE) ×2 IMPLANT
SYR CONTROL 10ML LL (SYRINGE) ×2 IMPLANT
TOWEL GREEN STERILE FF (TOWEL DISPOSABLE) ×2 IMPLANT
TRAY FAXITRON CT DISP (TRAY / TRAY PROCEDURE) ×2 IMPLANT
TUBE CONNECTING 20X1/4 (TUBING) ×1 IMPLANT
YANKAUER SUCT BULB TIP NO VENT (SUCTIONS) ×1 IMPLANT

## 2021-01-27 NOTE — Discharge Instructions (Addendum)
[facility] Office Phone Number [phone]  BREAST BIOPSY/ PARTIAL MASTECTOMY: POST OP INSTRUCTIONS  Always review your discharge instruction sheet given to you by the facility where your surgery was performed.  IF YOU HAVE DISABILITY OR FAMILY LEAVE FORMS, YOU MUST BRING THEM TO THE OFFICE FOR PROCESSING.  DO NOT GIVE THEM TO YOUR DOCTOR.  A prescription for pain medication may be given to you upon discharge.  Take your pain medication as prescribed, if needed.  If narcotic pain medicine is not needed, then you may take acetaminophen (Tylenol) or ibuprofen (Advil) as needed. Take your usually prescribed medications unless otherwise directed If you need a refill on your pain medication, please contact your pharmacy.  They will contact our office to request authorization.  Prescriptions will not be filled after 5pm or on week-ends. You should eat very light the first 24 hours after surgery, such as soup, crackers, pudding, etc.  Resume your normal diet the day after surgery. Most patients will experience some swelling and bruising in the breast.  Ice packs and a good support bra will help.  Swelling and bruising can take several days to resolve.  It is common to experience some constipation if taking pain medication after surgery.  Increasing fluid intake and taking a stool softener will usually help or prevent this problem from occurring.  A mild laxative (Milk of Magnesia or Miralax) should be taken according to package directions if there are no bowel movements after 48 hours. Unless discharge instructions indicate otherwise, you may remove your bandages 24-48 hours after surgery, and you may shower at that time.  You may have steri-strips (small skin tapes) in place directly over the incision.  These strips should be left on the skin for 7-10 days.  If your surgeon used skin glue on the incision, you may shower in 24 hours.  The glue will flake off over the next 2-3 weeks.  Any  sutures or staples will be removed at the office during your follow-up visit. ACTIVITIES:  You may resume regular daily activities (gradually increasing) beginning the next day.  Wearing a good support bra or sports bra minimizes pain and swelling.  You may have sexual intercourse when it is comfortable. You may drive when you no longer are taking prescription pain medication, you can comfortably wear a seatbelt, and you can safely maneuver your car and apply brakes. RETURN TO WORK:  ______________________________________________________________________________________ You should see your doctor in the office for a follow-up appointment approximately two weeks after your surgery.  Your doctor's nurse will typically make your follow-up appointment when she calls you with your pathology report.  Expect your pathology report 2-3 business days after your surgery.  You may call to check if you do not hear from us after three days. OTHER INSTRUCTIONS: _______________________________________________________________________________________________ _____________________________________________________________________________________________________________________________________ _____________________________________________________________________________________________________________________________________ _____________________________________________________________________________________________________________________________________  WHEN TO CALL YOUR DOCTOR: Fever over 101.0 Nausea and/or vomiting. Extreme swelling or bruising. Continued bleeding from incision. Increased pain, redness, or drainage from the incision.  The clinic staff is available to answer your questions during regular business hours.  Please don't hesitate to call and ask to speak to one of the nurses for clinical concerns.  If you have a medical emergency, go to the nearest emergency room or call 911.  A surgeon from [facility] is always on call at the hospital.  For further questions, please visit [email or web address]    Post Anesthesia Home Care Instructions  Activity: Get plenty of rest for the remainder of   the day. A responsible individual must stay with you for 24 hours following the procedure.  For the next 24 hours, DO NOT: -Drive a car -Operate machinery -Drink alcoholic beverages -Take any medication unless instructed by your physician -Make any legal decisions or sign important papers.  Meals: Start with liquid foods such as gelatin or soup. Progress to regular foods as tolerated. Avoid greasy, spicy, heavy foods. If nausea and/or vomiting occur, drink only clear liquids until the nausea and/or vomiting subsides. Call your physician if vomiting continues.  Special Instructions/Symptoms: Your throat may feel dry or sore from the anesthesia or the breathing tube placed in your throat during surgery. If this causes discomfort, gargle with warm salt water. The discomfort should disappear within 24 hours.  If you had a scopolamine patch placed behind your ear for the management of post- operative nausea and/or vomiting:  1. The medication in the patch is effective for 72 hours, after which it should be removed.  Wrap patch in a tissue and discard in the trash. Wash hands thoroughly with soap and water. 2. You may remove the patch earlier than 72 hours if you experience unpleasant side effects which may include dry mouth, dizziness or visual disturbances. 3. Avoid touching the patch. Wash your hands with soap and water after contact with the patch.      

## 2021-01-27 NOTE — Anesthesia Postprocedure Evaluation (Signed)
Anesthesia Post Note  Patient: Pam Porter  Procedure(s) Performed: RIGHT BREAST LUMPECTOMY WITH RADIOACTIVE SEED LOCALIZATION (Right: Breast)     Patient location during evaluation: PACU Anesthesia Type: General Level of consciousness: awake and alert, patient cooperative and oriented Pain management: pain level controlled Vital Signs Assessment: post-procedure vital signs reviewed and stable Respiratory status: spontaneous breathing, nonlabored ventilation and respiratory function stable Cardiovascular status: blood pressure returned to baseline and stable Postop Assessment: no apparent nausea or vomiting, able to ambulate and adequate PO intake Anesthetic complications: no   No notable events documented.  Last Vitals:  Vitals:   01/27/21 0900 01/27/21 0902  BP: (!) 111/51 129/65  Pulse: 79 86  Resp: 16 16  Temp:  (!) 36.2 C  SpO2: 93% 93%    Last Pain:  Vitals:   01/27/21 0902  TempSrc:   PainSc: 0-No pain                 Trason Shifflet,E. Korene Dula

## 2021-01-27 NOTE — Interval H&P Note (Signed)
History and Physical Interval Note:  01/27/2021 7:27 AM  Pam Porter  has presented today for surgery, with the diagnosis of RIGHT BREAST PAPILLOMA.  The various methods of treatment have been discussed with the patient and family. After consideration of risks, benefits and other options for treatment, the patient has consented to  Procedure(s): RIGHT BREAST LUMPECTOMY WITH RADIOACTIVE SEED LOCALIZATION (Right) as a surgical intervention.  The patient's history has been reviewed, patient examined, no change in status, stable for surgery.  I have reviewed the patient's chart and labs.  Questions were answered to the patient's satisfaction.     [place]

## 2021-01-27 NOTE — Op Note (Signed)
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  This was detected on screening mammogram and core biopsy.  She has had no symptoms of breast pain breast mass or nipple discharge.  We discussed the pros and cons of removal as well as that of observation.  We discussed the low potential risk of malignancy of being somewhere around 4% but there was a potential for upgrade to atypical lesion of anywhere from 4 to 10% range.  We discussed observation as well.  She did not wish to observe this and desired removal.The procedure has been discussed with the patient. Alternatives to surgery have been discussed with the patient.  Risks of surgery include bleeding,  Infection, cosmesis, seroma formation, death,  and the need for further surgery.   The patient understands and wishes to proceed.     Description of procedure: The patient was met in the holding area and questions were answered.  Films were available for review.  She was then taken back to the operating.  She was placed supine upon the OR table.  After induction of general esthesia, the right breast was prepped and draped in sterile fashion timeout performed.  Proper patient, site and procedure verified and the films were available in the operating room for reference.  Neoprobe was used to identify the seed right breast upper outer quadrant.  Incision was made in the upper right breast approximately 2 cm from the nipple  areolar complex.  Dissection was carried down all tissue and the seed and clip were excised with a grossly negative margin.  The Faxitron image was reviewed and the seed and clip were present and this was reviewed by radiology.  The specimen was oriented with ink and sent to pathology.  Hemostasis was achieved with cautery.  Irrigation was used and suctioned out.  Local anesthetic was infiltrated throughout the cavity.  Vancomycin powder placed.  The wound was then closed the deep layer 3-0 Vicryl and 4 Monocryl was used in a subcuticular closure.  Dermabond applied.  Breast binder placed.  All counts found to be correct.  The patient was awoke extubated taken to recovery in satisfactory condition.

## 2021-01-27 NOTE — Transfer of Care (Signed)
Immediate Anesthesia Transfer of Care Note  Patient: Pam Porter  Procedure(s) Performed: RIGHT BREAST LUMPECTOMY WITH RADIOACTIVE SEED LOCALIZATION (Right: Breast)  Patient Location: PACU  Anesthesia Type:General  Level of Consciousness: drowsy, patient cooperative and responds to stimulation  Airway & Oxygen Therapy: Patient Spontanous Breathing and Patient connected to face mask oxygen  Post-op Assessment: Report given to RN and Post -op Vital signs reviewed and stable  Post vital signs: Reviewed and stable  Last Vitals:  Vitals Value Taken Time  BP    Temp    Pulse 82 01/27/21 0830  Resp    SpO2 98 % 01/27/21 0830  Vitals shown include unvalidated device data.  Last Pain:  Vitals:   01/27/21 0635  TempSrc: Oral  PainSc: 1          Complications: No notable events documented.

## 2021-01-27 NOTE — Anesthesia Procedure Notes (Signed)
Procedure Name: LMA Insertion Date/Time: 01/27/2021 7:38 AM Performed by: Glory Buff, CRNA Pre-anesthesia Checklist: Patient identified, Emergency Drugs available, Suction available and Patient being monitored Patient Re-evaluated:Patient Re-evaluated prior to induction Oxygen Delivery Method: Circle system utilized Preoxygenation: Pre-oxygenation with 100% oxygen Induction Type: IV induction LMA: LMA inserted LMA Size: 4.0 Number of attempts: 1 Placement Confirmation: positive ETCO2 and breath sounds checked- equal and bilateral Tube secured with: Tape Dental Injury: Teeth and Oropharynx as per pre-operative assessment

## 2021-01-27 NOTE — H&P (Signed)
Subjective  Chief Complaint: Breast Mass   History of Present Illness: Pam Porter is a 75 y.o. female who is seen today as an office consultation at the request of Dr. Madilyn Fireman for evaluation of Breast Mass .   Patient presents for evaluation of abnormal mammogram. She was noted to have a abnormality right breast upper outer quadrant core biopsy proven to be a sclerosing papilloma. No family history of breast cancer. No other breast biopsies done. She is been followed for what sounds like fibrocystic change in the past with observation. No pain, mass or discharge noted in either breast.  Review of Systems: A complete review of systems was obtained from the patient. I have reviewed this information and discussed as appropriate with the patient. See HPI as well for other ROS.    Medical History: Past Medical History:  Diagnosis Date   Hypertension   There is no problem list on file for this patient.  Past Surgical History:  Procedure Laterality Date   back surgery   HYSTERECTOMY    Allergies  Allergen Reactions   Amoxicillin Nausea  Other reaction(s): nausea   Amoxapine Nausea   Current Outpatient Medications on File Prior to Visit  Medication Sig Dispense Refill   hydroCHLOROthiazide (HYDRODIURIL) 12.5 MG tablet Take 12.5 mg by mouth once daily   hydrOXYzine (ATARAX) 25 MG tablet Take 25 mg by mouth at bedtime   losartan (COZAAR) 50 MG tablet Take 50 mg by mouth 2 (two) times daily   lovastatin (MEVACOR) 10 MG tablet TAKE 1 TABLET BY MOUTH WITH THE EVENING MEAL FOR 30 DAYS   No current facility-administered medications on file prior to visit.   Family History  Problem Relation Age of Onset   Obesity Mother   High blood pressure (Hypertension) Mother   Diabetes Sister    Social History   Tobacco Use  Smoking Status Not on file  Smokeless Tobacco Not on file    Social History   Socioeconomic History   Marital status: Married   Objective:   Vitals:   12/21/20 1136  BP: (!) 152/60  Pulse: 93  Temp: 37.3 C (99.2 F)  SpO2: 99%  Weight: 90.9 kg (200 lb 6.4 oz)  Height: 170.2 cm (5\' 7" )   Body mass index is 31.39 kg/m.  Physical Exam HENT:  Head: Normocephalic.  Eyes:  General: No scleral icterus. Pupils: Pupils are equal, round, and reactive to light.  Cardiovascular:  Rate and Rhythm: Normal rate.  Pulmonary:  Effort: Pulmonary effort is normal.  Breath sounds: No stridor.  Chest:  Breasts:  Right: No mass.  Left: Normal. No mass.   Musculoskeletal:  Cervical back: Normal range of motion.  Lymphadenopathy:  Upper Body:  Right upper body: No supraclavicular or axillary adenopathy.  Left upper body: No supraclavicular or axillary adenopathy.  Skin: General: Skin is warm and dry.  Neurological:  General: No focal deficit present.  Mental Status: She is alert and oriented to person, place, and time.  Psychiatric:  Mood and Affect: Mood normal.  Behavior: Behavior normal.     Labs, Imaging and Diagnostic Testing: Diagnosis Breast, right, needle core biopsy, 12 o'clock - SCLEROSING PAPILLARY LESION. SEE NOTE Diagnosis Note Dr. Saralyn Pilar reviewed the case and concurs with the diagnosis. [facility] was notified on 12/10/2020. Jaquita Folds MD Pathologist, Electronic Signature (Case signed 12/10/2020)  CLINICAL DATA: 75 year old female presenting as a recall from screening for possible right breast mass.   EXAM: DIGITAL DIAGNOSTIC  UNILATERAL RIGHT MAMMOGRAM WITH TOMOSYNTHESIS AND CAD; ULTRASOUND RIGHT BREAST LIMITED   TECHNIQUE: Right digital diagnostic mammography and breast tomosynthesis was performed. The images were evaluated with computer-aided detection.; Targeted ultrasound examination of the right breast was performed   COMPARISON: Previous exam(s).   ACR Breast Density Category b: There are scattered areas of fibroglandular density.   FINDINGS: Mammogram:    Right breast: Spot compression tomosynthesis views of the right breast were performed demonstrating persistence of a small irregular mass in the superior central right breast measuring approximately 0.7 cm.   Ultrasound:   Targeted ultrasound performed in the right breast at 12 o'clock 3 cm from the nipple demonstrating an irregular hypoechoic mass measuring 0.8 x 0.7 x 0.9 cm. No internal vascularity. This corresponds to the mammographic finding.   Targeted ultrasound of the right axilla demonstrates multiple normal lymph nodes.   IMPRESSION: Suspicious mass in the right breast at 12 o'clock measuring 0.9 cm.   RECOMMENDATION: Ultrasound-guided core needle biopsy of the right breast mass at 12 o'clock.   I have discussed the findings and recommendations with the patient who agrees to proceed with biopsy. The patient will be scheduled for the biopsy appointment prior to leaving the office today.   BI-RADS CATEGORY 4: Suspicious.     Electronically Signed By: Audie Pinto M.D. On: 11/24/2020 13:39  Assessment and Plan:  Diagnoses and all orders for this visit:  Intraductal papilloma of right breast    Discussed observation given the natural history of papilloma versus excision. Risk benefits and long-term expectations and risk of cancer with these lesions discussed being somewhere around 4% or less. It may be lower than that. We discussed observation but she is done this before and has chosen to have the area removed. Risk, benefits, complications and long-term expectations of surgery discussed.The procedure has been discussed with the patient. Alternatives to surgery have been discussed with the patient. Risks of surgery include bleeding, Infection, Seroma formation, death, and the need for further surgery. The patient understands and wishes to proceed.  No follow-ups on file.  Kennieth Francois, MD

## 2021-01-28 ENCOUNTER — Encounter (HOSPITAL_BASED_OUTPATIENT_CLINIC_OR_DEPARTMENT_OTHER): Payer: Self-pay | Admitting: Surgery

## 2021-02-02 LAB — SURGICAL PATHOLOGY

## 2021-02-03 ENCOUNTER — Encounter: Payer: Self-pay | Admitting: Surgery

## 2021-04-09 ENCOUNTER — Other Ambulatory Visit: Payer: Self-pay | Admitting: Surgery

## 2021-04-09 DIAGNOSIS — Z9189 Other specified personal risk factors, not elsewhere classified: Secondary | ICD-10-CM

## 2021-04-21 ENCOUNTER — Other Ambulatory Visit: Payer: Self-pay

## 2021-04-21 ENCOUNTER — Ambulatory Visit
Admission: RE | Admit: 2021-04-21 | Discharge: 2021-04-21 | Disposition: A | Discharge status: Home / Self Care | Payer: Medicare Other | Source: Ambulatory Visit | Attending: Surgery | Admitting: Surgery

## 2021-04-21 DIAGNOSIS — Z1239 Encounter for other screening for malignant neoplasm of breast: Secondary | ICD-10-CM | POA: Diagnosis not present

## 2021-04-21 DIAGNOSIS — Z9189 Other specified personal risk factors, not elsewhere classified: Secondary | ICD-10-CM

## 2021-04-21 MED ORDER — GADOBUTROL 1 MMOL/ML IV SOLN
9.0000 mL | Freq: Once | INTRAVENOUS | Status: AC | PRN
  Administered 2021-04-21: 9 mL via INTRAVENOUS

## 2021-04-27 ENCOUNTER — Encounter (HOSPITAL_COMMUNITY): Payer: Self-pay

## 2021-05-03 ENCOUNTER — Encounter: Payer: Self-pay | Admitting: Family Medicine

## 2021-05-03 NOTE — Telephone Encounter (Signed)
Think she is really just due for an A1c looks like her BMP is up-to-date.  And we can do an A1c with a fingerstick while she is here if she would like. ?

## 2021-05-10 ENCOUNTER — Ambulatory Visit (INDEPENDENT_AMBULATORY_CARE_PROVIDER_SITE_OTHER): Payer: Medicare Other | Admitting: Family Medicine

## 2021-05-10 ENCOUNTER — Encounter: Payer: Self-pay | Admitting: Family Medicine

## 2021-05-10 ENCOUNTER — Other Ambulatory Visit: Payer: Self-pay

## 2021-05-10 VITALS — BP 138/43 | HR 73 | Resp 18 | Ht 62.0 in | Wt 201.0 lb

## 2021-05-10 DIAGNOSIS — Z9889 Other specified postprocedural states: Secondary | ICD-10-CM

## 2021-05-10 DIAGNOSIS — M85859 Other specified disorders of bone density and structure, unspecified thigh: Secondary | ICD-10-CM | POA: Insufficient documentation

## 2021-05-10 DIAGNOSIS — Z860100 Personal history of colon polyps, unspecified: Secondary | ICD-10-CM | POA: Insufficient documentation

## 2021-05-10 DIAGNOSIS — R7301 Impaired fasting glucose: Secondary | ICD-10-CM

## 2021-05-10 DIAGNOSIS — I10 Essential (primary) hypertension: Secondary | ICD-10-CM | POA: Diagnosis not present

## 2021-05-10 LAB — POCT GLYCOSYLATED HEMOGLOBIN (HGB A1C): Hemoglobin A1C: 6 % — AB (ref 4.0–5.6)

## 2021-05-10 MED ORDER — LOSARTAN POTASSIUM 50 MG PO TABS: 50.0000 mg | ORAL_TABLET | Freq: Two times a day (BID) | ORAL | 3 refills | Status: DC

## 2021-05-10 NOTE — Assessment & Plan Note (Signed)
Well controlled. Continue current regimen. Follow up in  6 mo  

## 2021-05-10 NOTE — Assessment & Plan Note (Signed)
01/27/21. Follow with Dr. Brantley Stage. 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

## 2021-05-10 NOTE — Progress Notes (Signed)
? ?Established Patient Office Visit ? ?Subjective:  ?Patient ID: Pam Porter, female    DOB: June 19, 1945  Age: 76 y.o. MRN: [id number] ? ?CC:  ?Chief Complaint  ?Patient presents with  ? Hypertension  ?  Follow up   ? Impaired Fasting Glucose   ?  Follow up   ? Discuss Medication   ?  Patient stated she has a higher copay now for the Hydroxyzine. Patient would like to discuss cheaper prescription.   ? Bone Density  ?  Done 2 years ago in 10/2021 with [facility]. Record release form faxed.   ? ? ?HPI ?[facility] presents for  ? ?Hypertension- Pt denies chest pain, SOB, dizziness, or heart palpitations.  Taking meds as directed w/o problems.  Denies medication side effects.   ? ?Impaired fasting glucose-no increased thirst or urination. No symptoms consistent with hypoglycemia. ? ?Last saw her she had a right breast lumpectomy.  She went for her mammogram and they noticed an abnormality and did a biopsy.  It showed intraductal papilloma of the right breast so they scheduled her for lumpectomy.  Now she is just under monitoring.  Radioactive seed localization performed as well.  By Dr. Brantley Stage at general surgery. ? ? ?Past Medical History:  ?Diagnosis Date  ? Arthritis   ? GERD (gastroesophageal reflux disease)   ? Hypertension   ? Skin cancer   ? basal cell  ? ? ?Past Surgical History:  ?Procedure Laterality Date  ? BREAST LUMPECTOMY WITH RADIOACTIVE SEED LOCALIZATION Right 01/27/2021  ? Procedure: RIGHT BREAST LUMPECTOMY WITH RADIOACTIVE SEED LOCALIZATION;  Surgeon: Erroll Luna, MD;  Location: [place];  Service: General;  Laterality: Right;  ? LUMBAR LAMINECTOMY/DECOMPRESSION MICRODISCECTOMY Bilateral 03/11/2019  ? Procedure: LAMINECTOMY AND FORAMINOTOMY BILATERAL LUMBAR THREE- LUMBAR FOUR, LUMBAR FOUR- LUMBAR FIVE;  Surgeon: Newman Pies, MD;  Location: [place];  Service: Neurosurgery;  Laterality: Bilateral;  LAMINECTOMY AND FORAMINOTOMY BILATERAL LUMBAR THREE-  LUMBAR FOUR, LUMBAR FOUR- LUMBAR FIVE  ? TOTAL ABDOMINAL HYSTERECTOMY    ? ? ?Family History  ?Problem Relation Age of Onset  ? Diabetes Mother   ? Hypertension Mother   ? ? ?Social History  ? ?Socioeconomic History  ? Marital status: Married  ?  Spouse name: Not on file  ? Number of children: Not on file  ? Years of education: Not on file  ? Highest education level: Not on file  ?Occupational History  ? Not on file  ?Tobacco Use  ? Smoking status: Never  ? Smokeless tobacco: Never  ?Vaping Use  ? Vaping Use: Never used  ?Substance and Sexual Activity  ? Alcohol use: Never  ? Drug use: Never  ? Sexual activity: Not Currently  ?Other Topics Concern  ? Not on file  ?Social History Narrative  ? Not on file  ? ?Social Determinants of Health  ? ?Financial Resource Strain: Not on file  ?Food Insecurity: Not on file  ?Transportation Needs: Not on file  ?Physical Activity: Not on file  ?Stress: Not on file  ?Social Connections: Not on file  ?Intimate Partner Violence: Not on file  ? ? ?Outpatient Medications Prior to Visit  ?Medication Sig Dispense Refill  ? amLODipine (NORVASC) 2.5 MG tablet Take 2.5 mg by mouth daily. Patient takes 1 tablet daily as needed only.    ? cholecalciferol (VITAMIN D) 25 MCG (1000 UNIT) tablet Take 1,000 mcg by mouth daily.    ? docusate sodium (COLACE) 100 MG capsule every evening.    ?  Flaxseed, Linseed, (FLAX SEED OIL) 1000 MG CAPS every morning.    ? hydrochlorothiazide (HYDRODIURIL) 12.5 MG tablet Take 1 tablet (12.5 mg total) by mouth daily. 90 tablet 1  ? hydrOXYzine (ATARAX/VISTARIL) 25 MG tablet Take 1 tablet (25 mg total) by mouth daily. 90 tablet 1  ? lovastatin (MEVACOR) 10 MG tablet TAKE 1 TABLET BY MOUTH WITH THE EVENING MEAL FOR 30 DAYS 90 tablet 3  ? Magnesium 500 MG TABS Take 500 mg by mouth at bedtime.     ? Turmeric Curcumin 500 MG CAPS Take 1,000 mg by mouth 2 (two) times daily at 10 AM and 5 PM.    ? losartan (COZAAR) 50 MG tablet Take 50 mg by mouth in the morning and at  bedtime.    ? HYDROcodone-acetaminophen (NORCO/VICODIN) 5-325 MG tablet Take 1 tablet by mouth every 6 (six) hours as needed for moderate pain. 15 tablet 0  ? losartan (COZAAR) 100 MG tablet Take 0.5 tablets (50 mg total) by mouth 2 (two) times daily. 180 tablet 1  ? ?No facility-administered medications prior to visit.  ? ? ?Allergies  ?Allergen Reactions  ? Amoxicillin   ?  Other reaction(s): nausea  ? Amoxapine And Related Nausea Only  ? Tape Itching and Rash  ?  Occurred after 2-3 days of adhesive on skin.  ? ? ?ROS ?Review of Systems ? ?  ?Objective:  ?  ?Physical Exam ?Constitutional:   ?   Appearance: Normal appearance. She is well-developed.  ?HENT:  ?   Head: Normocephalic and atraumatic.  ?Cardiovascular:  ?   Rate and Rhythm: Normal rate and regular rhythm.  ?   Heart sounds: Normal heart sounds.  ?Pulmonary:  ?   Effort: Pulmonary effort is normal.  ?   Breath sounds: Normal breath sounds.  ?Skin: ?   General: Skin is warm and dry.  ?Neurological:  ?   Mental Status: She is alert and oriented to person, place, and time.  ?Psychiatric:     ?   Behavior: Behavior normal.  ? ? ?BP (!) 138/43   Pulse 73   Resp 18   Ht '5\' 2"'  (1.575 m)   Wt 201 lb (91.2 kg)   SpO2 96%   BMI 36.76 kg/m?  ?Wt Readings from Last 3 Encounters:  ?05/10/21 201 lb (91.2 kg)  ?01/27/21 200 lb 6.4 oz (90.9 kg)  ?11/09/20 200 lb (90.7 kg)  ? ? ? ?Health Maintenance Due  ?Topic Date Due  ? Zoster Vaccines- Shingrix (1 of 2) Never done  ? DEXA SCAN  Never done  ? ? ?There are no preventive care reminders to display for this patient. ? ?No results found for: TSH ?Lab Results  ?Component Value Date  ? WBC 5.5 11/04/2020  ? HGB 15.5 11/04/2020  ? HCT 45.7 (H) 11/04/2020  ? MCV 87.9 11/04/2020  ? PLT 274 11/04/2020  ? ?Lab Results  ?Component Value Date  ? NA 138 01/20/2021  ? K 4.5 01/20/2021  ? CO2 30 01/20/2021  ? GLUCOSE 124 (H) 01/20/2021  ? BUN 13 01/20/2021  ? CREATININE 0.75 01/20/2021  ? BILITOT 0.7 11/04/2020  ? AST 19  11/04/2020  ? ALT 24 11/04/2020  ? PROT 6.1 11/04/2020  ? CALCIUM 9.2 01/20/2021  ? ANIONGAP 6 01/20/2021  ? EGFR 82 11/04/2020  ? ?Lab Results  ?Component Value Date  ? CHOL 173 11/04/2020  ? ?Lab Results  ?Component Value Date  ? HDL 56 11/04/2020  ? ?Lab Results  ?  Component Value Date  ? [place] 96 11/04/2020  ? ?Lab Results  ?Component Value Date  ? TRIG 116 11/04/2020  ? ?Lab Results  ?Component Value Date  ? CHOLHDL 3.1 11/04/2020  ? ?Lab Results  ?Component Value Date  ? HGBA1C 6.0 (A) 05/10/2021  ? ? ?  ?Assessment & Plan:  ? ?Problem List Items Addressed This Visit   ? ?  ? Cardiovascular and Mediastinum  ? Essential hypertension - Primary  ?  Well controlled. Continue current regimen. Follow up in  41mo ?  ?  ? Relevant Medications  ? losartan (COZAAR) 50 MG tablet  ? amLODipine (NORVASC) 2.5 MG tablet  ?  ? Endocrine  ? Impaired fasting glucose  ?  A1C of 6.0. Looks great!!!  ?  ?  ? Relevant Orders  ? POCT glycosylated hemoglobin (Hb A1C) (Completed)  ?  ? Other  ? History of lumpectomy of right breast  ?  01/27/21. Follow with Dr. 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  ?  ? ? ?Meds ordered this encounter  ?Medications  ? losartan (COZAAR) 50 MG tablet  ?  Sig: Take 1 tablet (50 mg total) by mouth in the morning and at bedtime.  ?  Dispense:  180 tablet  ?  Refill:  3  ? ? ?Follow-up: Return in about 6 months (around 11/10/2021) for Wellness Exam.  ? ? ?CBeatrice Lecher MD ?

## 2021-05-10 NOTE — Assessment & Plan Note (Signed)
A1C of 6.0. Looks great!!!  ?

## 2021-05-19 ENCOUNTER — Other Ambulatory Visit: Payer: Self-pay | Admitting: Family Medicine

## 2021-06-17 DIAGNOSIS — L820 Inflamed seborrheic keratosis: Secondary | ICD-10-CM | POA: Diagnosis not present

## 2021-06-17 DIAGNOSIS — D2272 Melanocytic nevi of left lower limb, including hip: Secondary | ICD-10-CM | POA: Diagnosis not present

## 2021-06-17 DIAGNOSIS — B078 Other viral warts: Secondary | ICD-10-CM | POA: Diagnosis not present

## 2021-06-17 DIAGNOSIS — D1801 Hemangioma of skin and subcutaneous tissue: Secondary | ICD-10-CM | POA: Diagnosis not present

## 2021-06-17 DIAGNOSIS — Z85828 Personal history of other malignant neoplasm of skin: Secondary | ICD-10-CM | POA: Diagnosis not present

## 2021-06-17 DIAGNOSIS — L570 Actinic keratosis: Secondary | ICD-10-CM | POA: Diagnosis not present

## 2021-06-17 DIAGNOSIS — L821 Other seborrheic keratosis: Secondary | ICD-10-CM | POA: Diagnosis not present

## 2021-06-17 DIAGNOSIS — D225 Melanocytic nevi of trunk: Secondary | ICD-10-CM | POA: Diagnosis not present

## 2021-06-17 DIAGNOSIS — L814 Other melanin hyperpigmentation: Secondary | ICD-10-CM | POA: Diagnosis not present

## 2021-06-17 DIAGNOSIS — D2239 Melanocytic nevi of other parts of face: Secondary | ICD-10-CM | POA: Diagnosis not present

## 2021-06-28 DIAGNOSIS — E119 Type 2 diabetes mellitus without complications: Secondary | ICD-10-CM | POA: Diagnosis not present

## 2021-06-28 DIAGNOSIS — H25813 Combined forms of age-related cataract, bilateral: Secondary | ICD-10-CM | POA: Diagnosis not present

## 2021-06-28 DIAGNOSIS — H43813 Vitreous degeneration, bilateral: Secondary | ICD-10-CM | POA: Diagnosis not present

## 2021-06-28 DIAGNOSIS — H5203 Hypermetropia, bilateral: Secondary | ICD-10-CM | POA: Diagnosis not present

## 2021-09-15 DIAGNOSIS — H43813 Vitreous degeneration, bilateral: Secondary | ICD-10-CM | POA: Diagnosis not present

## 2021-09-15 DIAGNOSIS — E119 Type 2 diabetes mellitus without complications: Secondary | ICD-10-CM | POA: Diagnosis not present

## 2021-09-15 DIAGNOSIS — H25813 Combined forms of age-related cataract, bilateral: Secondary | ICD-10-CM | POA: Diagnosis not present

## 2021-09-15 DIAGNOSIS — H35363 Drusen (degenerative) of macula, bilateral: Secondary | ICD-10-CM | POA: Diagnosis not present

## 2021-09-15 DIAGNOSIS — H52223 Regular astigmatism, bilateral: Secondary | ICD-10-CM | POA: Diagnosis not present

## 2021-10-25 DIAGNOSIS — H25813 Combined forms of age-related cataract, bilateral: Secondary | ICD-10-CM | POA: Diagnosis not present

## 2021-11-09 DIAGNOSIS — Z90710 Acquired absence of both cervix and uterus: Secondary | ICD-10-CM | POA: Diagnosis not present

## 2021-11-09 DIAGNOSIS — Z79899 Other long term (current) drug therapy: Secondary | ICD-10-CM | POA: Diagnosis not present

## 2021-11-09 DIAGNOSIS — E039 Hypothyroidism, unspecified: Secondary | ICD-10-CM | POA: Diagnosis not present

## 2021-11-09 DIAGNOSIS — H25811 Combined forms of age-related cataract, right eye: Secondary | ICD-10-CM | POA: Diagnosis not present

## 2021-11-09 DIAGNOSIS — H25813 Combined forms of age-related cataract, bilateral: Secondary | ICD-10-CM | POA: Diagnosis not present

## 2021-11-09 DIAGNOSIS — E1136 Type 2 diabetes mellitus with diabetic cataract: Secondary | ICD-10-CM | POA: Diagnosis not present

## 2021-11-09 DIAGNOSIS — I10 Essential (primary) hypertension: Secondary | ICD-10-CM | POA: Diagnosis not present

## 2021-11-09 DIAGNOSIS — Z880 Allergy status to penicillin: Secondary | ICD-10-CM | POA: Diagnosis not present

## 2021-11-09 HISTORY — PX: CATARACT EXTRACTION: SUR2

## 2021-11-09 HISTORY — PX: CATARACT EXTRACTION W/ INTRAOCULAR LENS IMPLANT: SHX1309

## 2021-11-12 ENCOUNTER — Ambulatory Visit (INDEPENDENT_AMBULATORY_CARE_PROVIDER_SITE_OTHER): Payer: Medicare Other | Admitting: Family Medicine

## 2021-11-12 VITALS — BP 127/63 | HR 67 | Ht 61.0 in | Wt 202.0 lb

## 2021-11-12 DIAGNOSIS — Z1231 Encounter for screening mammogram for malignant neoplasm of breast: Secondary | ICD-10-CM

## 2021-11-12 DIAGNOSIS — Z0000 Encounter for general adult medical examination without abnormal findings: Secondary | ICD-10-CM

## 2021-11-12 NOTE — Progress Notes (Signed)
MEDICARE ANNUAL WELLNESS VISIT  11/12/2021  Subjective:  Pam Porter is a 76 y.o. female patient of Metheney, Rene Kocher, MD who had a Medicare Annual Wellness Visit today. Pam Porter is Retired and lives with their spouse. Pam Porter has 2 children. Pam Porter reports that Pam Porter is socially active and does interact with friends/family regularly. Pam Porter is minimally physically active and enjoys travelling, gardening and reading.  Patient Care Team: Hali Marry, MD as PCP - General (Family Medicine)     11/12/2021    4:10 PM 01/27/2021    6:32 AM 03/11/2019    1:00 PM 03/07/2019   10:24 AM  Advanced Directives  Does Patient Have a Medical Advance Directive? Yes Yes Yes Yes  Type of Advance Directive Living will [place];Living will [place];Living will [place];Living will  Does patient want to make changes to medical advance directive? No - Patient declined No - Patient declined No - Patient declined No - Patient declined  Copy of [place] in Chart?  No - copy requested No - copy requested No - copy requested  Would patient like information on creating a medical advance directive?  No - Patient declined      Hospital Utilization Over the Past 12 Months: # of hospitalizations or ER visits: 0 # of surgeries: 1  Review of Systems    Patient reports that her overall health is unchanged when compared to last year.  Review of Systems: History obtained from chart review and the patient  All other systems negative.  Pain Assessment Pain : No/denies pain     Current Medications & Allergies (verified) Allergies as of 11/12/2021       Reactions   Amoxicillin    Other reaction(s): nausea   Amoxapine And Related Nausea Only   Tape Itching, Rash   Occurred after 2-3 days of adhesive on skin.        Medication List        Accurate as of November 12, 2021  4:37 PM. If you have any questions, ask your  nurse or doctor.          amLODipine 2.5 MG tablet Commonly known as: NORVASC Take 2.5 mg by mouth daily. Patient takes 1 tablet daily as needed only.   cholecalciferol 25 MCG (1000 UNIT) tablet Commonly known as: VITAMIN D3 Take 1,000 mcg by mouth daily.   docusate sodium 100 MG capsule Commonly known as: COLACE every evening.   Flax Seed Oil 1000 MG Caps every morning.   hydrochlorothiazide 12.5 MG tablet Commonly known as: HYDRODIURIL TAKE 1 TABLET BY MOUTH EVERY DAY   hydrOXYzine 25 MG tablet Commonly known as: ATARAX Take 1 tablet (25 mg total) by mouth daily.   losartan 50 MG tablet Commonly known as: COZAAR Take 1 tablet (50 mg total) by mouth in the morning and at bedtime.   lovastatin 10 MG tablet Commonly known as: MEVACOR TAKE 1 TABLET BY MOUTH WITH THE EVENING MEAL FOR 30 DAYS   Magnesium 500 MG Tabs Take 500 mg by mouth at bedtime.   Turmeric Curcumin 500 MG Caps Take 1,000 mg by mouth 2 (two) times daily at 10 AM and 5 PM.        History (reviewed): Past Medical History:  Diagnosis Date   Arthritis    GERD (gastroesophageal reflux disease)    Hypertension    Skin cancer    basal cell   Past Surgical History:  Procedure  Laterality Date   BREAST LUMPECTOMY WITH RADIOACTIVE SEED LOCALIZATION Right 01/27/2021   Procedure: RIGHT BREAST LUMPECTOMY WITH RADIOACTIVE SEED LOCALIZATION;  Surgeon: Erroll Luna, MD;  Location: [place];  Service: General;  Laterality: Right;   CATARACT EXTRACTION Right 11/09/2021   LUMBAR LAMINECTOMY/DECOMPRESSION MICRODISCECTOMY Bilateral 03/11/2019   Procedure: LAMINECTOMY AND FORAMINOTOMY BILATERAL LUMBAR THREE- LUMBAR FOUR, LUMBAR FOUR- LUMBAR FIVE;  Surgeon: Newman Pies, MD;  Location: [place];  Service: Neurosurgery;  Laterality: Bilateral;  LAMINECTOMY AND FORAMINOTOMY BILATERAL LUMBAR THREE- LUMBAR FOUR, LUMBAR FOUR- LUMBAR FIVE   TOTAL ABDOMINAL HYSTERECTOMY     Family History   Problem Relation Age of Onset   Diabetes Mother    Hypertension Mother    Social History   Socioeconomic History   Marital status: Married    Spouse name: Pam Porter   Number of children: 2   Years of education: 13   Highest education level: Some college, no degree  Occupational History   Occupation: Retired  Tobacco Use   Smoking status: Never   Smokeless tobacco: Never  Vaping Use   Vaping Use: Never used  Substance and Sexual Activity   Alcohol use: Never   Drug use: Never   Sexual activity: Not Currently  Other Topics Concern   Not on file  Social History Narrative   Lives with spouse. Pam Porter has two sons. Pam Porter enjoys travelling, gardening and reading.   Social Determinants of Health   Financial Resource Strain: Low Risk  (11/12/2021)   Overall Financial Resource Strain (CARDIA)    Difficulty of Paying Living Expenses: Not hard at all  Food Insecurity: No Food Insecurity (11/12/2021)   Hunger Vital Sign    Worried About Running Out of Food in the Last Year: Never true    Ran Out of Food in the Last Year: Never true  Transportation Needs: No Transportation Needs (11/12/2021)   PRAPARE - Hydrologist (Medical): No    Lack of Transportation (Non-Medical): No  Physical Activity: Inactive (11/12/2021)   Exercise Vital Sign    Days of Exercise per Week: 0 days    Minutes of Exercise per Session: 0 min  Stress: No Stress Concern Present (11/12/2021)   [place]    Feeling of Stress : Not at all  Social Connections: [place] (11/12/2021)   Social Connection and Isolation Panel [NHANES]    Frequency of Communication with Friends and Family: More than three times a week    Frequency of Social Gatherings with Friends and Family: More than three times a week    Attends Religious Services: More than 4 times per year    Active Member of [employer] or Organizations: Yes    Attends  Archivist Meetings: More than 4 times per year    Marital Status: Married    Activities of Daily Living    11/12/2021    4:17 PM 01/27/2021    6:37 AM  In your present state of health, do you have any difficulty performing the following activities:  Hearing? 1 0  Comment some hearing loss; left ear is worse than right.   Vision? 0 0  Difficulty concentrating or making decisions? 0 0  Walking or climbing stairs? 0 0  Dressing or bathing? 0 0  Doing errands, shopping? 0   Preparing Food and eating ? N   Using the Toilet? N   In the past six months, have you accidently  leaked urine? N   Do you have problems with loss of bowel control? N   Managing your Medications? N   Managing your Finances? N   Housekeeping or managing your Housekeeping? N     Patient Education/Literacy How often do you need to have someone help you when you read instructions, pamphlets, or other written materials from your doctor or pharmacy?: 1 - Never What is the last grade level you completed in school?: some college  Exercise Current Exercise Habits: Home exercise routine, Type of exercise: walking, Time (Minutes): 40, Frequency (Times/Week): 2, Weekly Exercise (Minutes/Week): 80, Intensity: Mild, Exercise limited by: None identified  Diet Patient reports consuming 3 meals a day and 1 snack(s) a day Patient reports that her primary diet is: Regular Patient reports that Pam Porter does have regular access to food.   Depression Screen    11/12/2021    4:14 PM 05/10/2021   10:28 AM 08/04/2020    2:23 PM  PHQ 2/9 Scores  PHQ - 2 Score 0 0 0     Fall Risk    11/12/2021    4:13 PM 05/10/2021   10:28 AM  [place] in the past year? 1 0  Number falls in past yr: 0 0  Injury with Fall? 0 0  Risk for fall due to : History of fall(s) No Fall Risks  Follow up Falls evaluation completed Falls evaluation completed     Objective:   BP 127/63 (BP Location: Left Arm, Patient Position: Sitting,  Cuff Size: Large)   Pulse 67   Ht '5\' 1"'$  (1.549 m)   Wt 202 lb 0.6 oz (91.6 kg)   SpO2 95%   BMI 38.18 kg/m   Last Weight  Most recent update: 11/12/2021  3:59 PM    Weight  91.6 kg (202 lb 0.6 oz)             Body mass index is 38.18 kg/m.  Hearing/Vision  Pam Porter did not have difficulty with hearing/understanding during the face-to-face interview Pam Porter did not have difficulty with her vision during the face-to-face interview Reports that Pam Porter has had a formal eye exam by an eye care professional within the past year Reports that Pam Porter has not had a formal hearing evaluation within the past year  Cognitive Function:    11/12/2021    4:20 PM  6CIT Screen  What Year? 0 points  What month? 0 points  What time? 0 points  Count back from 20 0 points  Months in reverse 0 points  Repeat phrase 0 points  Total Score 0 points    Normal Cognitive Function Screening: Yes (Normal:0-7, Significant for Dysfunction: >8)  Immunization & Health Maintenance Record Immunization History  Administered Date(s) Administered   Influenza Split 11/26/2009, 01/28/2011   PFIZER Comirnaty(Gray Top)Covid-19 Tri-Sucrose Vaccine 10/22/2019, 11/19/2019   PFIZER(Purple Top)SARS-COV-2 Vaccination 10/22/2019, 11/19/2019   Pneumococcal Conjugate-13 06/12/2013   Pneumococcal Polysaccharide-23 07/17/2014   Td 09/11/2002, 10/31/2018   Tdap 06/07/2012   Zoster Recombinat (Shingrix) 06/15/2021, 10/25/2021   Zoster, Live 09/29/2009    Health Maintenance  Topic Date Due   COVID-19 Vaccine (5 - Pfizer risk series) 11/28/2021 (Originally 01/14/2020)   INFLUENZA VACCINE  05/29/2022 (Originally 09/28/2021)   DEXA SCAN  11/13/2022 (Originally 01/10/2013)   Hepatitis C Screening  11/13/2022 (Originally 01/10/1964)   MAMMOGRAM  11/05/2022   TETANUS/TDAP  10/30/2028   COLONOSCOPY (Pts 45-54yr Insurance coverage will need to be confirmed)  06/06/2030   Pneumonia Vaccine 65+ Years  old  Completed   Zoster  Vaccines- Shingrix  Completed   HPV VACCINES  Aged Out       Assessment  This is a routine wellness examination for Pam Porter.  Health Maintenance: Due or Overdue There are no preventive care reminders to display for this patient.   Pam Porter does not need a referral for [employer] Assistance: Care Management:   no Social Work:    no Prescription Assistance:  no Nutrition/Diabetes Education:  no   Plan:  Personalized Goals  Goals Addressed               This Visit's Progress     Patient Stated (pt-stated)        Pam Porter would like to loose 10 lbs.       Personalized Health Maintenance & Screening Recommendations  Influenza vaccine Screening mammography Bone densitometry screening  Declined flu vaccine Discuss bone density with PCP at next appt.  Lung Cancer Screening Recommended: no (Low Dose CT Chest recommended if Age 55-80 years, 30 pack-year currently smoking OR have quit w/in past 15 years) Hepatitis C Screening recommended: yes HIV Screening recommended: no  Advanced Directives: Written information was not given per the patient's request.  Referrals & Orders Orders Placed This Encounter  Procedures   Mammogram 3D SCREEN BREAST BILATERAL    Follow-up Plan Follow-up with Hali Marry, MD as planned Medicare wellness visit in one year.  AVS printed and given to the patient.   I have personally reviewed and noted the following in the patient's chart:   Medical and social history Use of alcohol, tobacco or illicit drugs  Current medications and supplements Functional ability and status Nutritional status Physical activity Advanced directives List of other physicians Hospitalizations, surgeries, and ER visits in previous 12 months Vitals Screenings to include cognitive, depression, and falls Referrals and appointments  In addition, I have reviewed and discussed with patient certain preventive protocols, quality metrics,  and best practice recommendations. A written personalized care plan for preventive services as well as general preventive health recommendations were provided to patient.     Tinnie Gens, RN BSN  11/12/2021

## 2021-11-12 NOTE — Patient Instructions (Addendum)
Pam Maintenance Summary and Written Plan of Care  [name] ,  Thank you for allowing me to perform your Medicare Annual Wellness Visit and for your ongoing commitment to your health.   Health Maintenance & Immunization History Health Maintenance  Topic Date Due   COVID-19 Vaccine (5 - Pfizer risk series) 11/28/2021 (Originally 01/14/2020)   INFLUENZA VACCINE  05/29/2022 (Originally 09/28/2021)   DEXA SCAN  11/13/2022 (Originally 01/10/2013)   Hepatitis C Screening  11/13/2022 (Originally 01/10/1964)   MAMMOGRAM  11/05/2022   TETANUS/TDAP  10/30/2028   COLONOSCOPY (Pts 45-78yr Insurance coverage will need to be confirmed)  06/06/2030   Pneumonia Vaccine 76 Years old  Completed   Zoster Vaccines- Shingrix  Completed   HPV VACCINES  Aged Out   Immunization History  Administered Date(s) Administered   Influenza Split 11/26/2009, 01/28/2011   PFIZER Comirnaty(Gray Top)Covid-19 Tri-Sucrose Vaccine 10/22/2019, 11/19/2019   PFIZER(Purple Top)SARS-COV-2 Vaccination 10/22/2019, 11/19/2019   Pneumococcal Conjugate-13 06/12/2013   Pneumococcal Polysaccharide-23 07/17/2014   Td 09/11/2002, 10/31/2018   Tdap 06/07/2012   Zoster Recombinat (Shingrix) 06/15/2021, 10/25/2021   Zoster, Live 09/29/2009    These are the patient goals that we discussed:  Goals Addressed               This Visit's Progress     Patient Stated (pt-stated)        She would like to loose 10 lbs.         This is a list of Health Maintenance Items that are overdue or due now: Influenza vaccine Screening mammography Bone densitometry screening    Orders/Referrals Placed Today: Orders Placed This Encounter  Procedures   Mammogram 3D SCREEN BREAST BILATERAL    Standing Status:   Future    Standing Expiration Date:   11/13/2022    Scheduling Instructions:     Please call patient to schedule.    Order Specific Question:   Reason for Exam (SYMPTOM  OR DIAGNOSIS  REQUIRED)    Answer:   Breast cancer screening    Order Specific Question:   Preferred imaging location?    Answer:   [facility]  (Contact our referral department at [phone] you have not spoken with someone about your referral appointment within the next 5 days)    Follow-up Plan Follow-up with MHali Marry MD as planned Medicare wellness visit in one year.  AVS printed and given to the patient.      Health Maintenance, Female Adopting a healthy lifestyle and getting preventive care are important in promoting health and wellness. Ask your health care provider about: The right schedule for you to have regular tests and exams. Things you can do on your own to prevent diseases and keep yourself healthy. What should I know about diet, weight, and exercise? Eat a healthy diet  Eat a diet that includes plenty of vegetables, fruits, low-fat dairy products, and lean protein. Do not eat a lot of foods that are high in solid fats, added sugars, or sodium. Maintain a healthy weight Body mass index (BMI) is used to identify weight problems. It estimates body fat based on height and weight. Your health care provider can help determine your BMI and help you achieve or maintain a healthy weight. Get regular exercise Get regular exercise. This is one of the most important things you can do for your health. Most adults should: Exercise for at least 150 minutes each week. The exercise should increase your heart  rate and make you sweat (moderate-intensity exercise). Do strengthening exercises at least twice a week. This is in addition to the moderate-intensity exercise. Spend less time sitting. Even light physical activity can be beneficial. Watch cholesterol and blood lipids Have your blood tested for lipids and cholesterol at 74 years of age, then have this test every 5 years. Have your cholesterol levels checked more often if: Your lipid or cholesterol levels are  high. You are older than 74 years of age. You are at high risk for heart disease. What should I know about cancer screening? Depending on your health history and family history, you may need to have cancer screening at various ages. This may include screening for: Breast cancer. Cervical cancer. Colorectal cancer. Skin cancer. Lung cancer. What should I know about heart disease, diabetes, and high blood pressure? Blood pressure and heart disease High blood pressure causes heart disease and increases the risk of stroke. This is more likely to develop in people who have high blood pressure readings or are overweight. Have your blood pressure checked: Every 3-5 years if you are 48-108 years of age. Every year if you are 81 years old or older. Diabetes Have regular diabetes screenings. This checks your fasting blood sugar level. Have the screening done: Once every three years after age 46 if you are at a normal weight and have a low risk for diabetes. More often and at a younger age if you are overweight or have a high risk for diabetes. What should I know about preventing infection? Hepatitis B If you have a higher risk for hepatitis B, you should be screened for this virus. Talk with your health care provider to find out if you are at risk for hepatitis B infection. Hepatitis C Testing is recommended for: Everyone born from [birth year] through 1963. Anyone with known risk factors for hepatitis C. Sexually transmitted infections (STIs) Get screened for STIs, including gonorrhea and chlamydia, if: You are sexually active and are younger than 74 years of age. You are older than 74 years of age and your health care provider tells you that you are at risk for this type of infection. Your sexual activity has changed since you were last screened, and you are at increased risk for chlamydia or gonorrhea. Ask your health care provider if you are at risk. Ask your health care provider about whether you  are at high risk for HIV. Your health care provider may recommend a prescription medicine to help prevent HIV infection. If you choose to take medicine to prevent HIV, you should first get tested for HIV. You should then be tested every 3 months for as long as you are taking the medicine. Pregnancy If you are about to stop having your period (premenopausal) and you may become pregnant, seek counseling before you get pregnant. Take 400 to 800 micrograms (mcg) of folic acid every day if you become pregnant. Ask for birth control (contraception) if you want to prevent pregnancy. Osteoporosis and menopause Osteoporosis is a disease in which the bones lose minerals and strength with aging. This can result in bone fractures. If you are 34 years old or older, or if you are at risk for osteoporosis and fractures, ask your health care provider if you should: Be screened for bone loss. Take a calcium or vitamin D supplement to lower your risk of fractures. Be given hormone replacement therapy (HRT) to treat symptoms of menopause. Follow these instructions at home: Alcohol use Do not drink alcohol if:  Your health care provider tells you not to drink. You are pregnant, may be pregnant, or are planning to become pregnant. If you drink alcohol: Limit how much you have to: 0-1 drink a day. Know how much alcohol is in your drink. In the [place] one drink equals one 12 oz bottle of beer (355 mL), one 5 oz glass of wine (148 mL), or one 1 oz glass of hard liquor (44 mL). Lifestyle Do not use any products that contain nicotine or tobacco. These products include cigarettes, chewing tobacco, and vaping devices, such as e-cigarettes. If you need help quitting, ask your health care provider. Do not use street drugs. Do not share needles. Ask your health care provider for help if you need support or information about quitting drugs. General instructions Schedule regular health, dental, and eye exams. Stay current  with your vaccines. Tell your health care provider if: You often feel depressed. You have ever been abused or do not feel safe at home. Summary Adopting a healthy lifestyle and getting preventive care are important in promoting health and wellness. Follow your health care provider's instructions about healthy diet, exercising, and getting tested or screened for diseases. Follow your health care provider's instructions on monitoring your cholesterol and blood pressure. This information is not intended to replace advice given to you by your health care provider. Make sure you discuss any questions you have with your health care provider. Document Revised: 07/06/2020 Document Reviewed: 07/06/2020 Elsevier Patient Education  [place].

## 2021-11-16 ENCOUNTER — Other Ambulatory Visit: Payer: Self-pay | Admitting: Family Medicine

## 2021-11-16 ENCOUNTER — Ambulatory Visit (INDEPENDENT_AMBULATORY_CARE_PROVIDER_SITE_OTHER): Payer: Medicare Other | Admitting: Family Medicine

## 2021-11-16 ENCOUNTER — Encounter: Payer: Self-pay | Admitting: Family Medicine

## 2021-11-16 VITALS — BP 136/62 | HR 90 | Wt 202.4 lb

## 2021-11-16 DIAGNOSIS — E785 Hyperlipidemia, unspecified: Secondary | ICD-10-CM | POA: Insufficient documentation

## 2021-11-16 DIAGNOSIS — E782 Mixed hyperlipidemia: Secondary | ICD-10-CM

## 2021-11-16 DIAGNOSIS — R7301 Impaired fasting glucose: Secondary | ICD-10-CM | POA: Diagnosis not present

## 2021-11-16 DIAGNOSIS — M1990 Unspecified osteoarthritis, unspecified site: Secondary | ICD-10-CM

## 2021-11-16 DIAGNOSIS — I10 Essential (primary) hypertension: Secondary | ICD-10-CM | POA: Diagnosis not present

## 2021-11-16 DIAGNOSIS — G4700 Insomnia, unspecified: Secondary | ICD-10-CM | POA: Insufficient documentation

## 2021-11-16 LAB — POCT GLYCOSYLATED HEMOGLOBIN (HGB A1C): Hemoglobin A1C: 5.9 % — AB (ref 4.0–5.6)

## 2021-11-16 MED ORDER — HYDROXYZINE HCL 25 MG PO TABS: 25.0000 mg | ORAL_TABLET | Freq: Every day | ORAL | 1 refills | Status: DC

## 2021-11-16 NOTE — Assessment & Plan Note (Signed)
Interestingly, she held her statin while she was taking Paxlovid and says that a lot of her joint pain is significantly improved.  We discussed giving it a couple of weeks and letting her get completely better and at that point may be restarting a half of a tab.  It may or may not be adequate to control her numbers but she can at least see if she is able to tolerate it and then speak with her PCP about whether or not that is adequate or it might need to be changed or adjusted.

## 2021-11-16 NOTE — Assessment & Plan Note (Signed)
See note above.  Recommend restart statin at half tab in about 2 weeks.

## 2021-11-16 NOTE — Assessment & Plan Note (Signed)
A1C looks great today at 5.9.  Did encourage her to get back into her walking routine.  She said she fell off a little bit after she got back from the beach.  But she plans on getting back on track.

## 2021-11-16 NOTE — Progress Notes (Signed)
Established Patient Office Visit  Subjective   Patient ID: Pam Porter, female    DOB: [birth year]  Age: 76 y.o. MRN: [id number]  Chief Complaint  Patient presents with   Hypertension   Diabetes    HPI Impaired fasting glucose-no increased thirst or urination. No symptoms consistent with hypoglycemia.  Hypertension- Pt denies chest pain, SOB, dizziness, or heart palpitations.  Taking meds as directed w/o problems.  Denies medication side effects.    Had right cataract surgery a week ago. Scheduled for the left next month.   Reports that she is not sleeping well.  She came off her Atarax at the beginning of the summer.  And she has been waking up every 1-2 hours having difficulty going back to sleep sometimes it can take hours.  No other changes.  She would really like to consider going back on it.  Past Surgical History:  Procedure Laterality Date   BREAST LUMPECTOMY WITH RADIOACTIVE SEED LOCALIZATION Right 01/27/2021   Procedure: RIGHT BREAST LUMPECTOMY WITH RADIOACTIVE SEED LOCALIZATION;  Surgeon: Erroll Luna, MD;  Location: [facility];  Service: General;  Laterality: Right;   CATARACT EXTRACTION Right 11/09/2021   CATARACT EXTRACTION W/ INTRAOCULAR LENS IMPLANT Right 11/09/2021   LUMBAR LAMINECTOMY/DECOMPRESSION MICRODISCECTOMY Bilateral 03/11/2019   Procedure: LAMINECTOMY AND FORAMINOTOMY BILATERAL LUMBAR THREE- LUMBAR FOUR, LUMBAR FOUR- LUMBAR FIVE;  Surgeon: Newman Pies, MD;  Location: [facility];  Service: Neurosurgery;  Laterality: Bilateral;  LAMINECTOMY AND FORAMINOTOMY BILATERAL LUMBAR THREE- LUMBAR FOUR, LUMBAR FOUR- LUMBAR FIVE   TOTAL ABDOMINAL HYSTERECTOMY      ROS    Objective:     BP 136/62   Pulse 90   Wt 202 lb 6.4 oz (91.8 kg)   SpO2 99%   BMI 38.24 kg/m    Physical Exam Vitals and nursing note reviewed.  Constitutional:      Appearance: She is well-developed.  HENT:     Head: Normocephalic and atraumatic.   Cardiovascular:     Rate and Rhythm: Normal rate and regular rhythm.     Heart sounds: Normal heart sounds.  Pulmonary:     Effort: Pulmonary effort is normal.     Breath sounds: Normal breath sounds.  Skin:    General: Skin is warm and dry.  Neurological:     Mental Status: She is alert and oriented to person, place, and time.  Psychiatric:        Behavior: Behavior normal.      Results for orders placed or performed in visit on 11/16/21  POCT glycosylated hemoglobin (Hb A1C)  Result Value Ref Range   Hemoglobin A1C 5.9 (A) 4.0 - 5.6 %   HbA1c POC (<> result, manual entry)     HbA1c, POC (prediabetic range)     HbA1c, POC (controlled diabetic range)        The 10-year ASCVD risk score (Arnett DK, et al., 2019) is: 33.1%    Assessment & Plan:   Problem List Items Addressed This Visit       Cardiovascular and Mediastinum   Essential hypertension    Pete blood pressure is better.  Continue current regimen due for some updated labs today.  Did really like to work on some weight loss.  Just encouraged her to continue to work on diet, portion control and regular exercise and get back on her walking routine.      Relevant Orders   POCT glycosylated hemoglobin (Hb A1C) (Completed)   Lipid panel   COMPLETE  METABOLIC PANEL WITH GFR   CBC     Endocrine   Impaired fasting glucose - Primary    A1C looks great today at 5.9.  Did encourage her to get back into her walking routine.  She said she fell off a little bit after she got back from the beach.  But she plans on getting back on track.      Relevant Orders   POCT glycosylated hemoglobin (Hb A1C) (Completed)   Lipid panel   COMPLETE METABOLIC PANEL WITH GFR   CBC     Musculoskeletal and Integument   Arthritis    Interestingly, she held her statin while she was taking Paxlovid and says that a lot of her joint pain is significantly improved.  We discussed giving it a couple of weeks and letting her get completely  better and at that point may be restarting a half of a tab.  It may or may not be adequate to control her numbers but she can at least see if she is able to tolerate it and then speak with her PCP about whether or not that is adequate or it might need to be changed or adjusted.        Other   Insomnia    Go ahead and restart Atarax it also help with her fall allergies and since it was helping with her sleep and she was not having any concerning side effects we will restart the medication.  We also discussed getting back on track with her walking and exercises that should help as well.      Hyperlipidemia    See note above.  Recommend restart statin at half tab in about 2 weeks.       Return in about 6 months (around 05/17/2022) for Hypertension, Prediabetes.    Beatrice Lecher, MD

## 2021-11-16 NOTE — Assessment & Plan Note (Signed)
Pam Porter blood pressure is better.  Continue current regimen due for some updated labs today.  Did really like to work on some weight loss.  Just encouraged her to continue to work on diet, portion control and regular exercise and get back on her walking routine.

## 2021-11-16 NOTE — Assessment & Plan Note (Signed)
Go ahead and restart Atarax it also help with her fall allergies and since it was helping with her sleep and she was not having any concerning side effects we will restart the medication.  We also discussed getting back on track with her walking and exercises that should help as well.

## 2021-11-17 LAB — CBC
HCT: 44.3 % (ref 35.0–45.0)
Hemoglobin: 15.2 g/dL (ref 11.7–15.5)
MCH: 29.9 pg (ref 27.0–33.0)
MCHC: 34.3 g/dL (ref 32.0–36.0)
MCV: 87 fL (ref 80.0–100.0)
MPV: 9.7 fL (ref 7.5–12.5)
Platelets: 288 10*3/uL (ref 140–400)
RBC: 5.09 10*6/uL (ref 3.80–5.10)
RDW: 12.7 % (ref 11.0–15.0)
WBC: 4.6 10*3/uL (ref 3.8–10.8)

## 2021-11-17 LAB — COMPLETE METABOLIC PANEL WITH GFR
AG Ratio: 2 (calc) (ref 1.0–2.5)
ALT: 25 U/L (ref 6–29)
AST: 18 U/L (ref 10–35)
Albumin: 4.3 g/dL (ref 3.6–5.1)
Alkaline phosphatase (APISO): 56 U/L (ref 37–153)
BUN: 17 mg/dL (ref 7–25)
CO2: 26 mmol/L (ref 20–32)
Calcium: 9.7 mg/dL (ref 8.6–10.4)
Chloride: 105 mmol/L (ref 98–110)
Creat: 0.67 mg/dL (ref 0.60–1.00)
Globulin: 2.1 g/dL (calc) (ref 1.9–3.7)
Glucose, Bld: 126 mg/dL — ABNORMAL HIGH (ref 65–99)
Potassium: 3.9 mmol/L (ref 3.5–5.3)
Sodium: 142 mmol/L (ref 135–146)
Total Bilirubin: 0.7 mg/dL (ref 0.2–1.2)
Total Protein: 6.4 g/dL (ref 6.1–8.1)
eGFR: 92 mL/min/{1.73_m2} (ref >=60)

## 2021-11-17 LAB — LIPID PANEL
Cholesterol: 174 mg/dL (ref <200)
HDL: 63 mg/dL (ref >=50)
LDL Cholesterol (Calc): 93 mg/dL (calc)
Non-HDL Cholesterol (Calc): 111 mg/dL (calc) (ref <130)
Total CHOL/HDL Ratio: 2.8 (calc) (ref <5.0)
Triglycerides: 87 mg/dL (ref <150)

## 2021-11-17 NOTE — Progress Notes (Signed)
Pam Porter, overall your labs look great!

## 2021-12-03 ENCOUNTER — Telehealth (HOSPITAL_BASED_OUTPATIENT_CLINIC_OR_DEPARTMENT_OTHER): Payer: Self-pay

## 2021-12-07 ENCOUNTER — Other Ambulatory Visit: Payer: Self-pay | Admitting: Family Medicine

## 2021-12-07 DIAGNOSIS — Z880 Allergy status to penicillin: Secondary | ICD-10-CM | POA: Diagnosis not present

## 2021-12-07 DIAGNOSIS — I10 Essential (primary) hypertension: Secondary | ICD-10-CM | POA: Diagnosis not present

## 2021-12-07 DIAGNOSIS — Z79899 Other long term (current) drug therapy: Secondary | ICD-10-CM | POA: Diagnosis not present

## 2021-12-07 DIAGNOSIS — H25812 Combined forms of age-related cataract, left eye: Secondary | ICD-10-CM | POA: Diagnosis not present

## 2021-12-07 DIAGNOSIS — H02831 Dermatochalasis of right upper eyelid: Secondary | ICD-10-CM | POA: Diagnosis not present

## 2021-12-07 DIAGNOSIS — Z90710 Acquired absence of both cervix and uterus: Secondary | ICD-10-CM | POA: Diagnosis not present

## 2021-12-07 DIAGNOSIS — Z9841 Cataract extraction status, right eye: Secondary | ICD-10-CM | POA: Diagnosis not present

## 2021-12-07 DIAGNOSIS — Z961 Presence of intraocular lens: Secondary | ICD-10-CM | POA: Diagnosis not present

## 2021-12-07 DIAGNOSIS — H02834 Dermatochalasis of left upper eyelid: Secondary | ICD-10-CM | POA: Diagnosis not present

## 2021-12-07 DIAGNOSIS — E785 Hyperlipidemia, unspecified: Secondary | ICD-10-CM

## 2022-02-10 ENCOUNTER — Encounter: Payer: Self-pay | Admitting: Family Medicine

## 2022-03-10 ENCOUNTER — Encounter: Payer: Self-pay | Admitting: Family Medicine

## 2022-03-14 ENCOUNTER — Ambulatory Visit
Admission: EM | Admit: 2022-03-14 | Discharge: 2022-03-14 | Disposition: A | Discharge status: Home / Self Care | Payer: Medicare Other | Attending: Family Medicine | Admitting: Family Medicine

## 2022-03-14 DIAGNOSIS — J209 Acute bronchitis, unspecified: Secondary | ICD-10-CM | POA: Diagnosis not present

## 2022-03-14 DIAGNOSIS — J069 Acute upper respiratory infection, unspecified: Secondary | ICD-10-CM | POA: Diagnosis not present

## 2022-03-14 MED ORDER — PROMETHAZINE-DM 6.25-15 MG/5ML PO SYRP: 5.0000 mL | ORAL_SOLUTION | Freq: Four times a day (QID) | ORAL | 0 refills | Status: DC | PRN

## 2022-03-14 MED ORDER — AZITHROMYCIN 250 MG PO TABS: ORAL_TABLET | ORAL | 0 refills | Status: DC

## 2022-03-14 NOTE — ED Triage Notes (Signed)
Pt presents to [facility] with co of cold like symptoms since 9 days ago.  Pt has been taking otc severe sinus and congestion with tylenol and musinex.  Pt reports difficulty sleeping due to cough and congestion.  2 neg at home covid test.

## 2022-03-14 NOTE — ED Provider Notes (Signed)
[name]    CSN: [id number] Arrival date & time: 03/14/22  1024      History   Chief Complaint Chief Complaint  Patient presents with   URI   Cough   Facial Pain   Nasal Congestion    HPI Pam Porter is a 77 y.o. female.   HPI  Pleasant 77 year old woman.  States she is prone to sinus infections.  Currently is here with sinus pressure and postnasal drip, mild sore throat, mostly she is worried with cough.  She states she has not been able to sleep for the last 3 nights.  She is having bad coughing spells.  She is feeling tired.  Slightly short of breath with exertion.  Had a fever initially but not now.  She states that she has been sick for 9 going on 10 days.  Past Medical History:  Diagnosis Date   Arthritis    GERD (gastroesophageal reflux disease)    Hypertension    Skin cancer    basal cell    Patient Active Problem List   Diagnosis Date Noted   Insomnia 11/16/2021   Hyperlipidemia 11/16/2021   Personal history of colonic polyps 05/10/2021   Other specified disorders of bone density and structure, unspecified thigh 05/10/2021   History of lumpectomy of right breast 05/10/2021   Arthritis 08/04/2020   Impaired fasting glucose 08/04/2020   Lumbar stenosis with neurogenic claudication 03/11/2019   Essential hypertension 01/18/2019    Past Surgical History:  Procedure Laterality Date   BREAST LUMPECTOMY WITH RADIOACTIVE SEED LOCALIZATION Right 01/27/2021   Procedure: RIGHT BREAST LUMPECTOMY WITH RADIOACTIVE SEED LOCALIZATION;  Surgeon: Erroll Luna, MD;  Location: [facility];  Service: General;  Laterality: Right;   CATARACT EXTRACTION Right 11/09/2021   CATARACT EXTRACTION W/ INTRAOCULAR LENS IMPLANT Right 11/09/2021   LUMBAR LAMINECTOMY/DECOMPRESSION MICRODISCECTOMY Bilateral 03/11/2019   Procedure: LAMINECTOMY AND FORAMINOTOMY BILATERAL LUMBAR THREE- LUMBAR FOUR, LUMBAR FOUR- LUMBAR FIVE;  Surgeon: Newman Pies,  MD;  Location: [facility];  Service: Neurosurgery;  Laterality: Bilateral;  LAMINECTOMY AND FORAMINOTOMY BILATERAL LUMBAR THREE- LUMBAR FOUR, LUMBAR FOUR- LUMBAR FIVE   TOTAL ABDOMINAL HYSTERECTOMY      OB History   No obstetric history on file.      Home Medications    Prior to Admission medications   Medication Sig Start Date End Date Taking? Authorizing Provider  azithromycin (ZITHROMAX Z-PAK) 250 MG tablet Take two pills today followed by one a day until gone 03/14/22  Yes Raylene Everts, MD  promethazine-dextromethorphan (PROMETHAZINE-DM) 6.25-15 MG/5ML syrup Take 5 mLs by mouth 4 (four) times daily as needed for cough. 03/14/22  Yes Raylene Everts, MD  amLODipine (NORVASC) 2.5 MG tablet Take 2.5 mg by mouth daily. Patient takes 1 tablet daily as needed only.    [provider]  cholecalciferol (VITAMIN D) 25 MCG (1000 UNIT) tablet Take 1,000 mcg by mouth daily.    [provider]  docusate sodium (COLACE) 100 MG capsule every evening.    [provider]  Flaxseed, Linseed, (FLAX SEED OIL) 1000 MG CAPS every morning.    [provider]  hydrochlorothiazide (HYDRODIURIL) 12.5 MG tablet TAKE 1 TABLET BY MOUTH EVERY DAY 11/23/21   Hali Marry, MD  hydrOXYzine (ATARAX) 25 MG tablet Take 1 tablet (25 mg total) by mouth at bedtime. 11/16/21   Hali Marry, MD  losartan (COZAAR) 50 MG tablet Take 1 tablet (50 mg total) by mouth in the morning and  at bedtime. 05/10/21   Hali Marry, MD  lovastatin (MEVACOR) 10 MG tablet TAKE 1 TABLET BY MOUTH WITH THE EVENING MEAL FOR 30 DAYS 12/07/21   Hali Marry, MD  Magnesium 500 MG TABS Take 500 mg by mouth at bedtime.     [provider]  Turmeric Curcumin 500 MG CAPS Take 1,000 mg by mouth 2 (two) times daily at 10 AM and 5 PM.    [provider]    Family History Family History  Problem Relation Age of Onset   Diabetes Mother    Hypertension Mother      Social History Social History   Tobacco Use   Smoking status: Never   Smokeless tobacco: Never  Vaping Use   Vaping Use: Never used  Substance Use Topics   Alcohol use: Never   Drug use: Never     Allergies   Amoxicillin, Amoxapine and related, and Tape   Review of Systems Review of Systems See HPI  Physical Exam Triage Vital Signs ED Triage Vitals  Enc Vitals Group     BP 03/14/22 1031 131/74     Pulse Rate 03/14/22 1031 89     Resp 03/14/22 1031 20     Temp 03/14/22 1031 98.6 F (37 C)     Temp Source 03/14/22 1031 Oral     SpO2 03/14/22 1031 96 %     Weight --      Height --      Head Circumference --      Peak Flow --      Pain Score 03/14/22 1029 0     Pain Loc --      Pain Edu? --      Excl. in [place]? --    No data found.  Updated Vital Signs BP 131/74   Pulse 89   Temp 98.6 F (37 C) (Oral)   Resp 20   SpO2 96%       Physical Exam Constitutional:      General: She is not in acute distress.    Appearance: She is well-developed.  HENT:     Head: Normocephalic and atraumatic.     Right Ear: Tympanic membrane normal.     Left Ear: Tympanic membrane and ear canal normal.     Nose: Congestion present.  Eyes:     Conjunctiva/sclera: Conjunctivae normal.     Pupils: Pupils are equal, round, and reactive to light.  Cardiovascular:     Rate and Rhythm: Normal rate and regular rhythm.     Heart sounds: Normal heart sounds.  Pulmonary:     Effort: Pulmonary effort is normal. No respiratory distress.     Breath sounds: Normal breath sounds.  Abdominal:     General: There is no distension.     Palpations: Abdomen is soft.  Musculoskeletal:        General: Normal range of motion.     Cervical back: Normal range of motion.  Skin:    General: Skin is warm and dry.  Neurological:     Mental Status: She is alert.      UC Treatments / Results  Labs (all labs ordered are listed, but only abnormal results are displayed) Labs Reviewed - No  data to display  EKG   Radiology No results found.  Procedures Procedures (including critical care time)  Medications Ordered in UC Medications - No data to display  Initial Impression / Assessment and Plan / UC Course  I  have reviewed the triage vital signs and the nursing notes.  Pertinent labs & imaging results that were available during my care of the patient were reviewed by me and considered in my medical decision making (see chart for details).     Final Clinical Impressions(s) / UC Diagnoses   Final diagnoses:  Upper respiratory tract infection, unspecified type  Acute bronchitis, unspecified organism     Discharge Instructions      Make sure that you are drinking lots of fluids Take the Z-Pak as directed.  2 pills today then 1 a day until gone Use Phenergan DM for coughing spells.  This may cause drowsiness. See your doctor if not improving towards the end of the week    ED Prescriptions     Medication Sig Dispense Auth. Provider   azithromycin (ZITHROMAX Z-PAK) 250 MG tablet Take two pills today followed by one a day until gone 6 tablet Raylene Everts, MD   promethazine-dextromethorphan (PROMETHAZINE-DM) 6.25-15 MG/5ML syrup Take 5 mLs by mouth 4 (four) times daily as needed for cough. 118 mL Raylene Everts, MD      PDMP not reviewed this encounter.   Raylene Everts, MD 03/14/22 [phone]

## 2022-03-14 NOTE — Discharge Instructions (Signed)
Make sure that you are drinking lots of fluids Take the Z-Pak as directed.  2 pills today then 1 a day until gone Use Phenergan DM for coughing spells.  This may cause drowsiness. See your doctor if not improving towards the end of the week

## 2022-04-07 IMAGING — MG MM PLC BREAST LOC DEV 1ST LESION INC*R*
4 series · 4 of 4 positions shown · non-contrast
Comparison: Previous exam(s).

CLINICAL DATA: Patient presents for radioactive seed localization
of a right breast papilloma prior to surgical excision.

EXAM:
MAMMOGRAPHIC GUIDED RADIOACTIVE SEED LOCALIZATION OF THE RIGHT
BREAST

[R CC (1 of 3)]
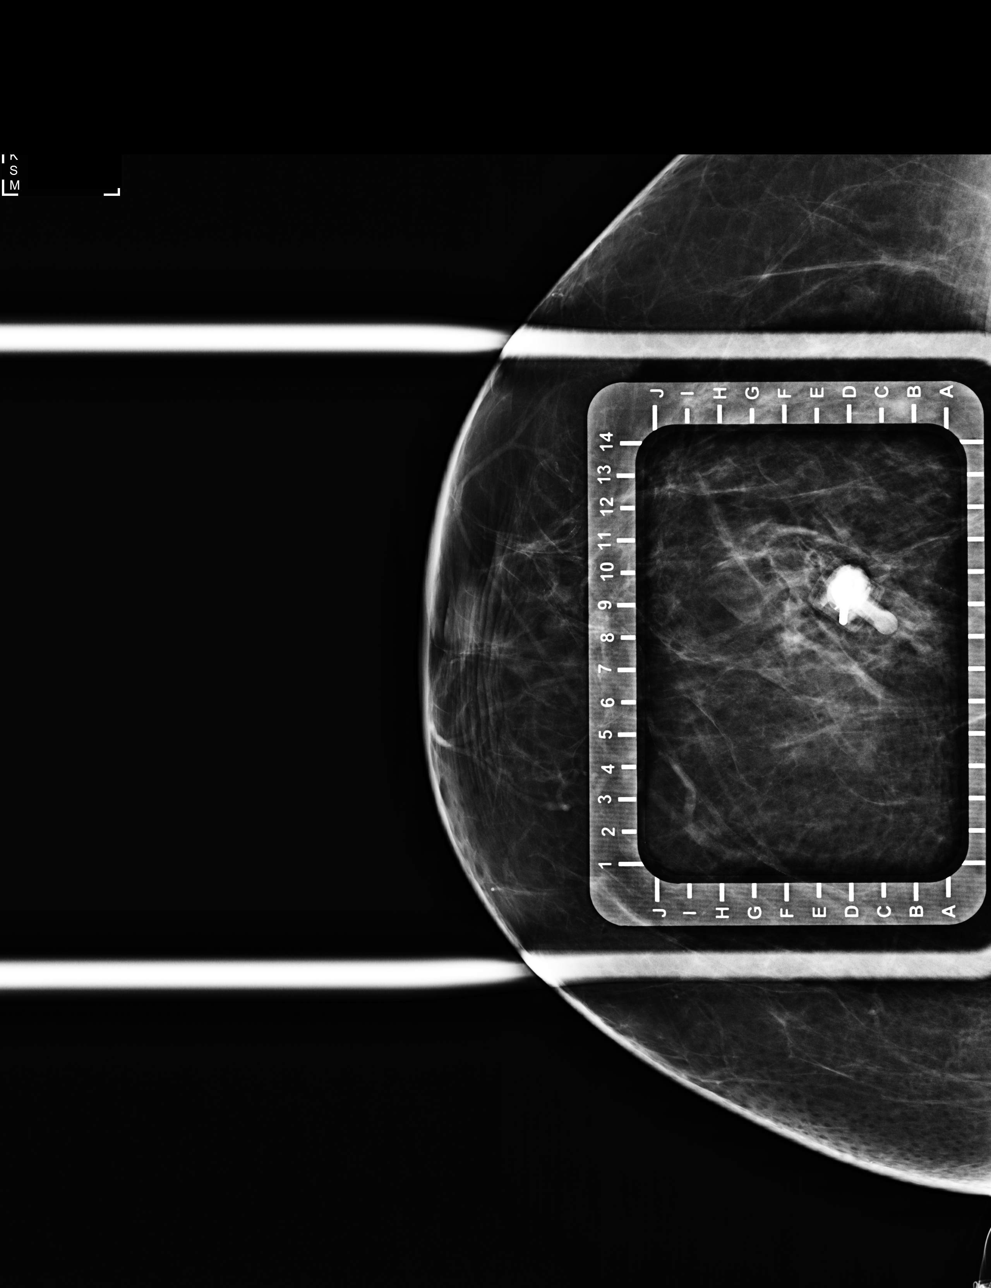

[R CC (2 of 3)]
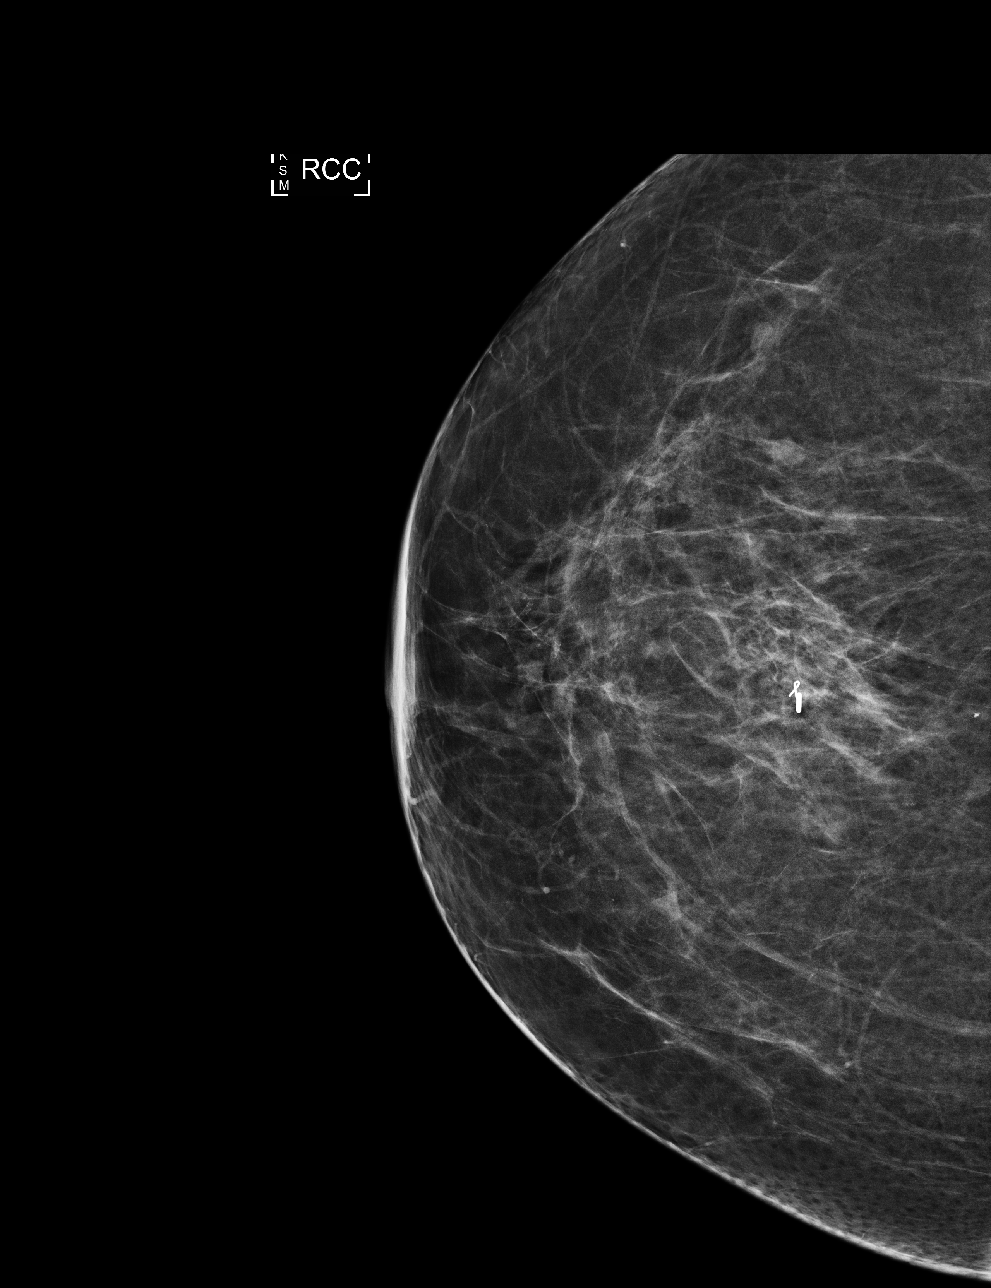

[R ML]
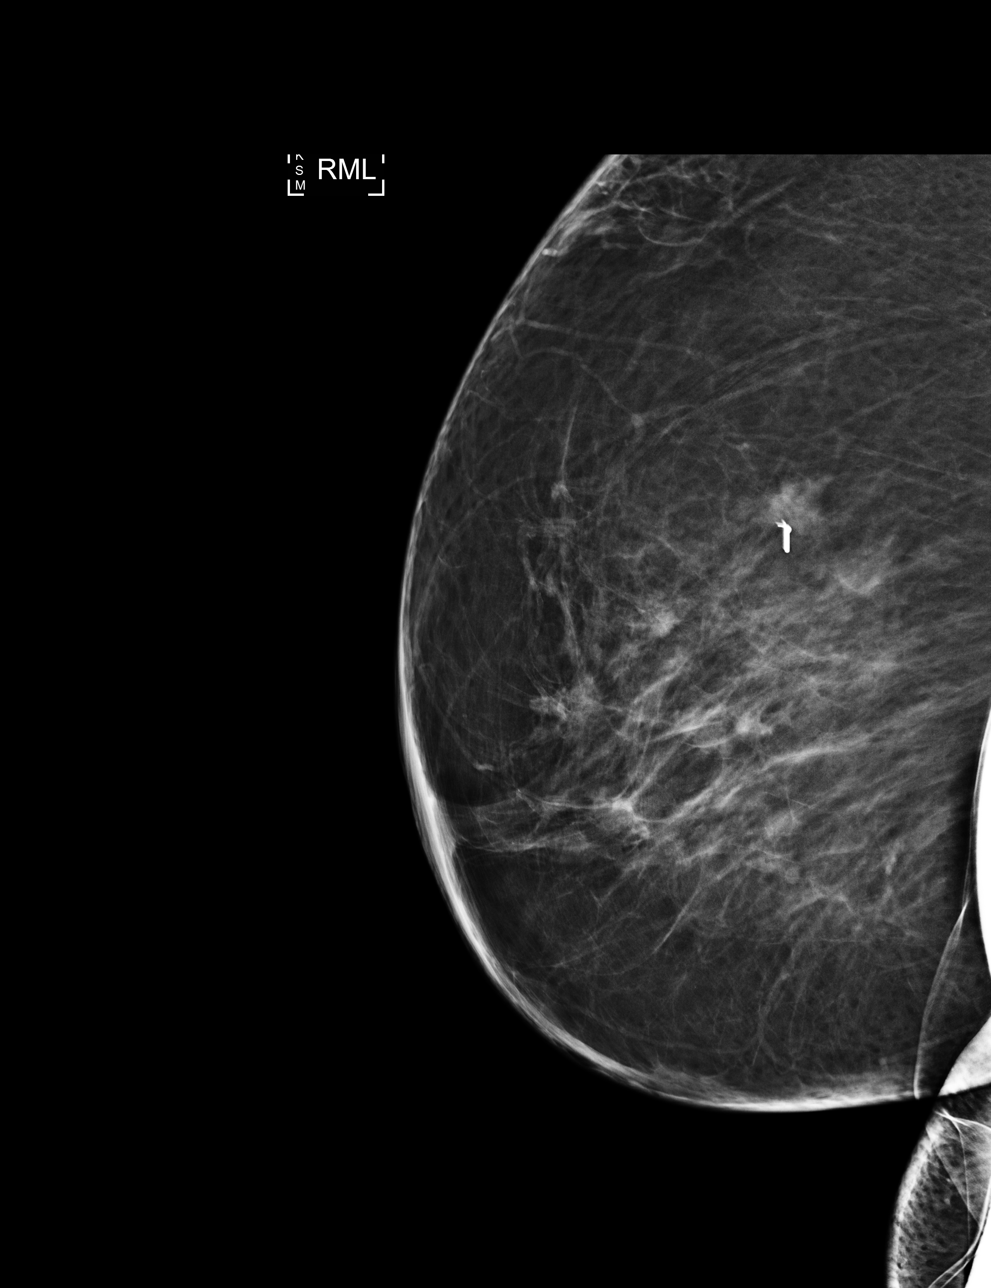

[R CC (3 of 3)]
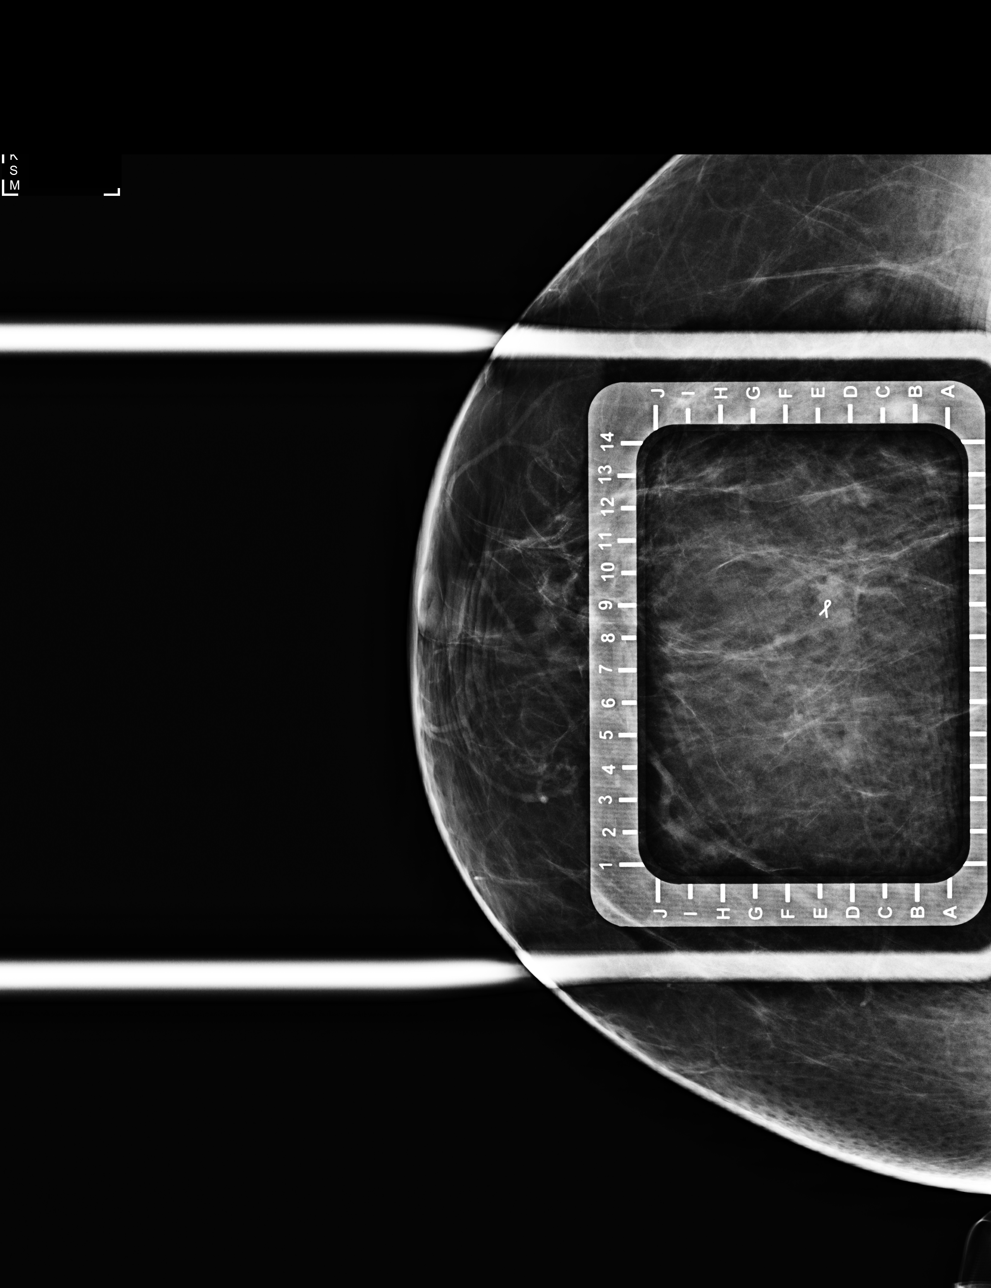

[4 of 4 positions shown; findings below may reference images not displayed]

FINDINGS: Patient presents for radioactive seed localization prior to . I met
with the patient and we discussed the procedure of seed localization
including benefits and alternatives. We discussed the high
likelihood of a successful procedure. We discussed the risks of the
procedure including infection, bleeding, tissue injury and further
surgery. We discussed the low dose of radioactivity involved in the
procedure. Informed, written consent was given.

The usual time-out protocol was performed immediately prior to the
procedure.

Using mammographic guidance, sterile technique, 1% lidocaine and an
4-OOR radioactive seed, the ribbon shaped biopsy clip was localized
using a superior approach. The follow-up mammogram images confirm
the seed in the expected location and were marked for Dr. Luchex.

Follow-up survey of the patient confirms presence of the radioactive
seed.

Order number of 4-OOR seed:  [id number].

Total activity:  0.261 millicuries reference Date: 12/07/2020

The patient tolerated the procedure well and was released from the
[REDACTED]. She was given instructions regarding seed removal.
IMPRESSION: Radioactive seed localization of the right breast. No apparent
complications.

## 2022-04-08 ENCOUNTER — Ambulatory Visit
Admission: RE | Admit: 2022-04-08 | Discharge: 2022-04-08 | Disposition: A | Discharge status: Home / Self Care | Payer: Medicare Other | Source: Ambulatory Visit | Attending: Family Medicine | Admitting: Family Medicine

## 2022-04-08 DIAGNOSIS — Z1231 Encounter for screening mammogram for malignant neoplasm of breast: Secondary | ICD-10-CM | POA: Diagnosis not present

## 2022-04-08 DIAGNOSIS — Z0000 Encounter for general adult medical examination without abnormal findings: Secondary | ICD-10-CM

## 2022-04-08 IMAGING — DX MM BREAST SURGICAL SPECIMEN
1 series · 2 of 2 positions shown · non-contrast
Comparison: Previous exam(s).

CLINICAL DATA: Right breast excision of a papilloma.

EXAM:
SPECIMEN RADIOGRAPH OF THE RIGHT BREAST

[Series 1: specimen digital x-ray · right · 0.07mm/px · 2 of 2 slices shown]
[im 1/2]
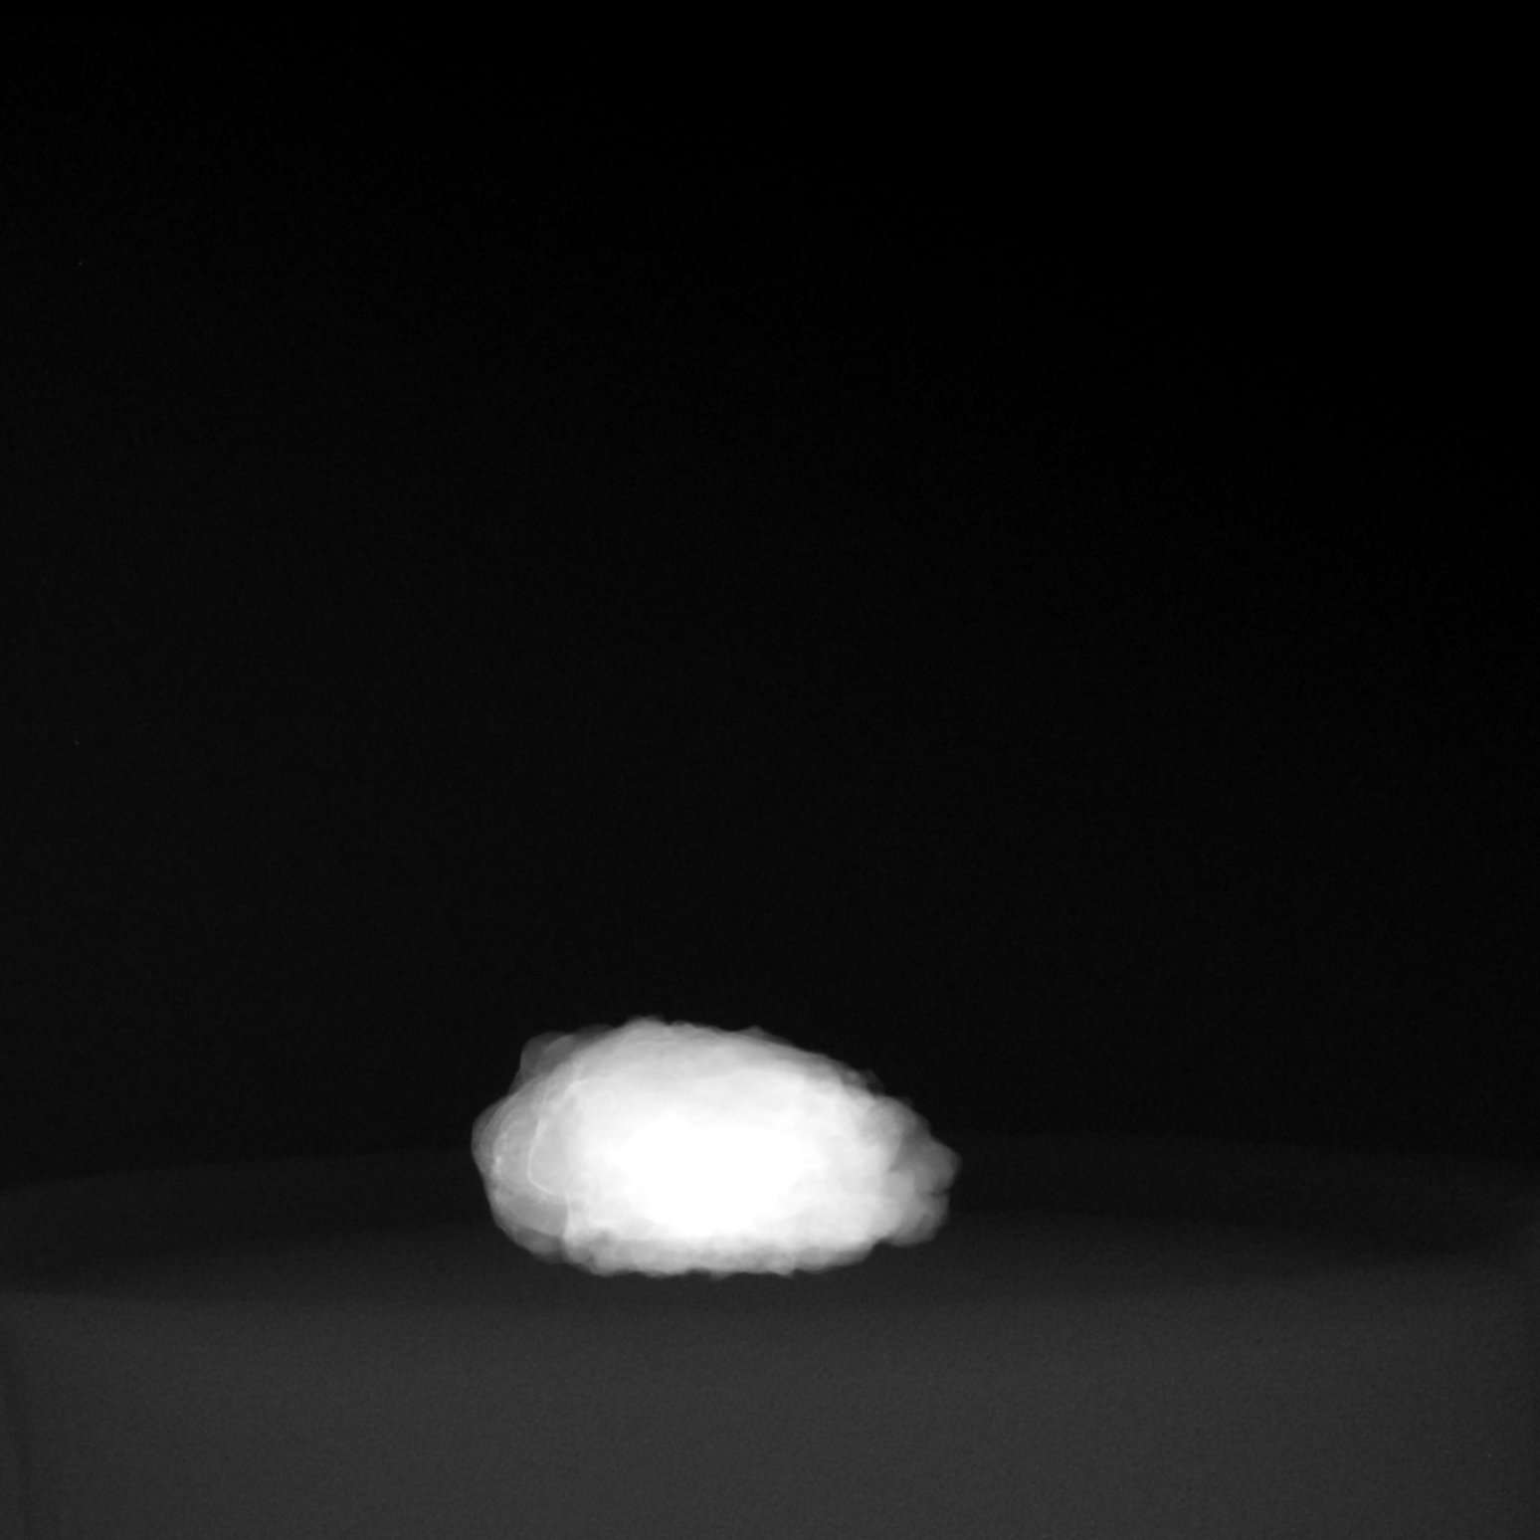
[im 2/2]
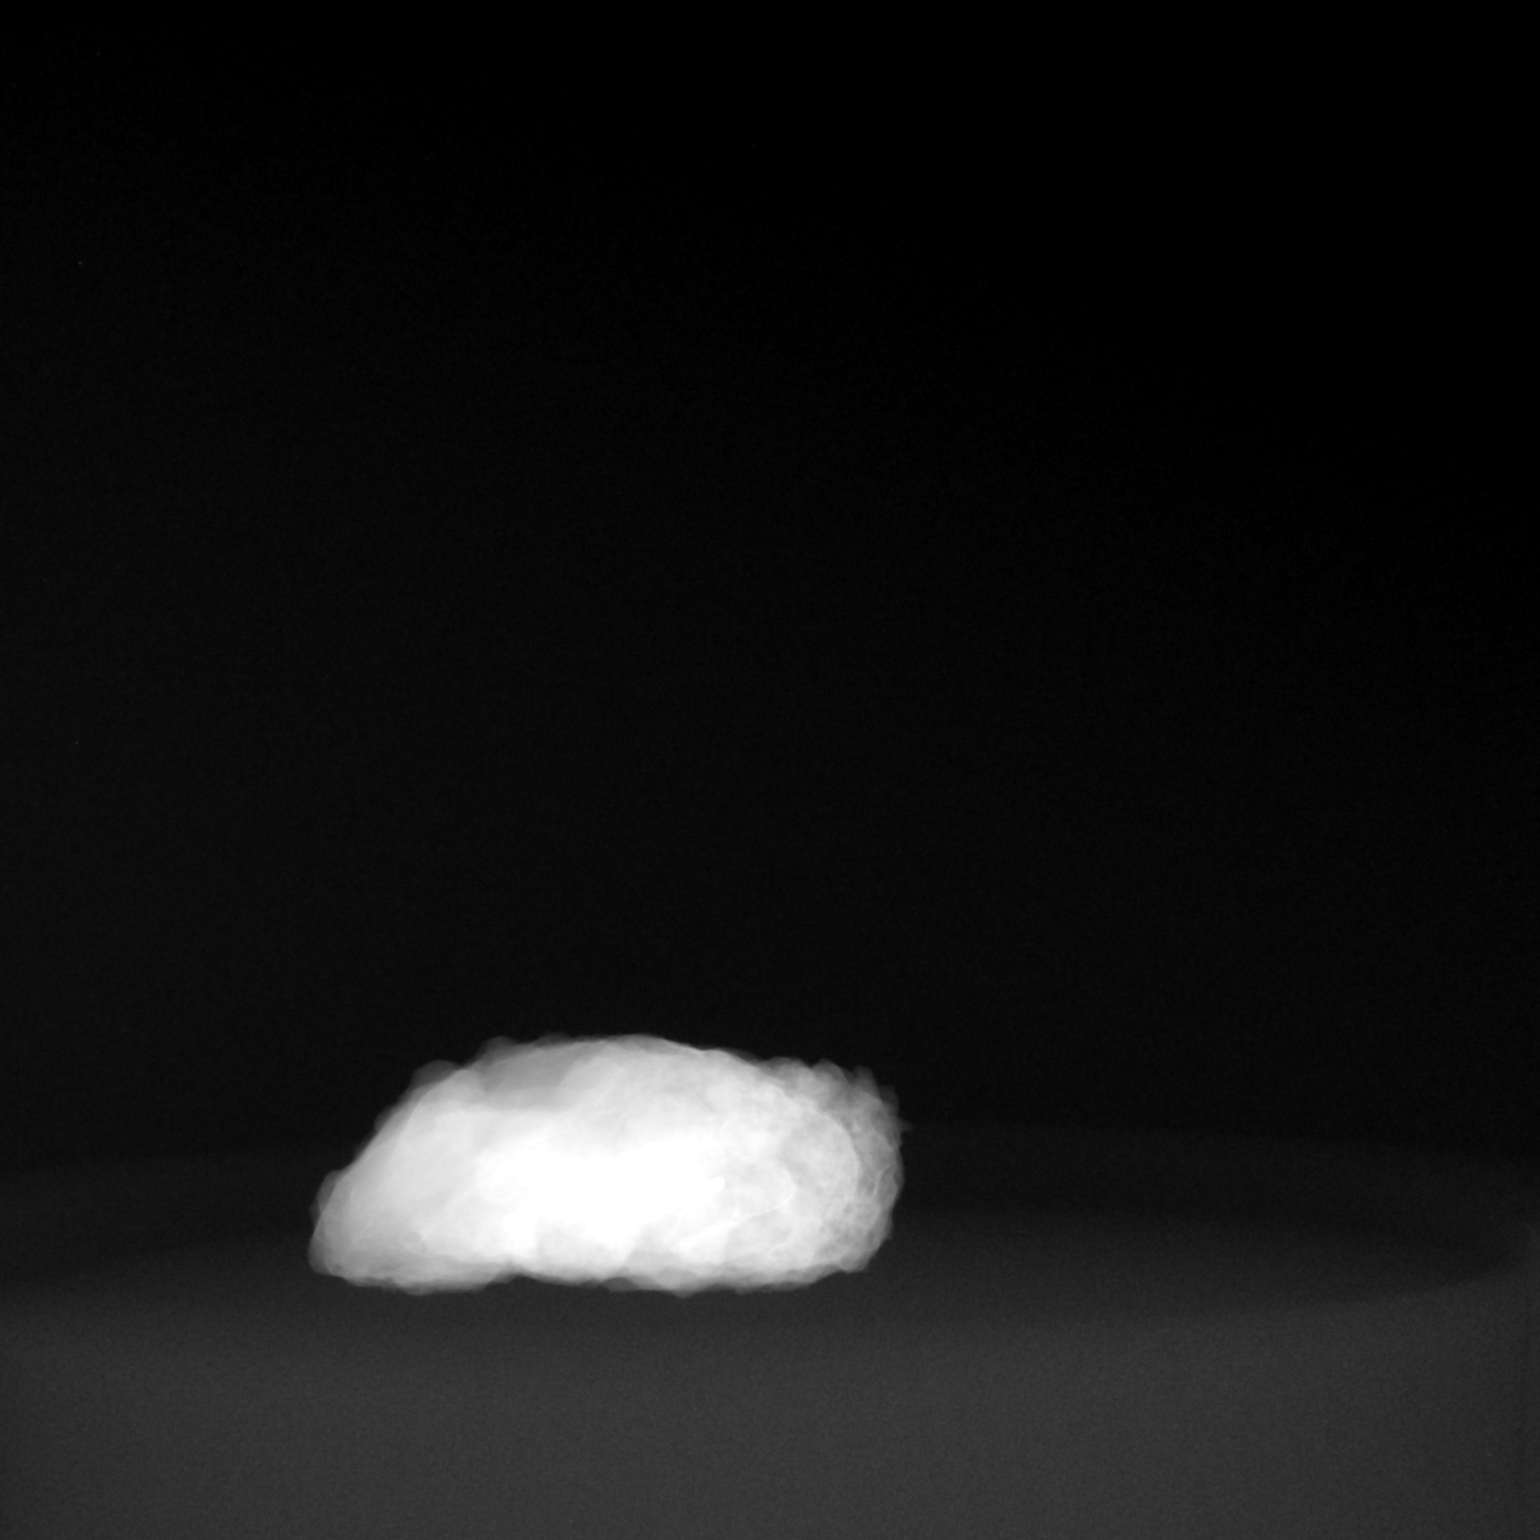

[2 of 2 positions shown; findings below may reference images not displayed]

FINDINGS: Status post excision of the right breast. The radioactive seed and
ribbon shaped biopsy marker clip are present, completely intact.
IMPRESSION: Specimen radiograph of the right breast.

## 2022-04-12 NOTE — Progress Notes (Signed)
Please call patient. Normal mammogram.  Repeat in 1 year.  

## 2022-04-13 ENCOUNTER — Encounter: Payer: Self-pay | Admitting: Family Medicine

## 2022-04-13 DIAGNOSIS — I10 Essential (primary) hypertension: Secondary | ICD-10-CM

## 2022-04-13 MED ORDER — AMLODIPINE BESYLATE 5 MG PO TABS: 5.0000 mg | ORAL_TABLET | Freq: Every day | ORAL | 1 refills | Status: DC

## 2022-05-25 ENCOUNTER — Ambulatory Visit (INDEPENDENT_AMBULATORY_CARE_PROVIDER_SITE_OTHER): Payer: Medicare Other | Admitting: Family Medicine

## 2022-05-25 ENCOUNTER — Encounter: Payer: Self-pay | Admitting: Family Medicine

## 2022-05-25 VITALS — BP 123/37 | HR 71 | Ht 61.0 in | Wt 200.0 lb

## 2022-05-25 DIAGNOSIS — R7301 Impaired fasting glucose: Secondary | ICD-10-CM

## 2022-05-25 DIAGNOSIS — G479 Sleep disorder, unspecified: Secondary | ICD-10-CM

## 2022-05-25 DIAGNOSIS — I10 Essential (primary) hypertension: Secondary | ICD-10-CM

## 2022-05-25 LAB — POCT GLYCOSYLATED HEMOGLOBIN (HGB A1C): Hemoglobin A1C: 5.9 % — AB (ref 4.0–5.6)

## 2022-05-25 NOTE — Assessment & Plan Note (Signed)
Hemoglobin A1c 5.9 today looks fantastic.  Continue current regimen.  Follow-up in 6 months.

## 2022-05-25 NOTE — Assessment & Plan Note (Signed)
Well controlled. Continue current regimen. Follow up in  6 mo  

## 2022-05-25 NOTE — Progress Notes (Signed)
Home BP cuff: 126 /69 P 71

## 2022-05-25 NOTE — Progress Notes (Signed)
Established Patient Office Visit  Subjective   Patient ID: Pam Porter, female    DOB: [birth year]  Age: 77 y.o. MRN: [id number]  Chief Complaint  Patient presents with   Hypertension   IFG    HPI  Hypertension- Pt denies chest pain, SOB, dizziness, or heart palpitations.  Taking meds as directed w/o problems.  Denies medication side effects.  Brught in home cuff.   Impaired fasting glucose-no increased thirst or urination. No symptoms consistent with hypoglycemia.  She has been going to the gym 4 days a week and pretty consistent with it.  He notices that after her workout her blood pressure will be a little bit on the lower side like 105/59.  By the time she gets home it is back to her normal usually in the 120s.  She also reports that she has not been sleeping well some nights are better than others usually its difficulty falling asleep.  Sometimes she will take the hydroxyzine and that helps but some nights it does not.  She does drink coffee in the morning and tea in the afternoon and evening.     ROS    Objective:     BP (!) 123/37   Pulse 71   Ht 5\' 1"  (1.549 m)   Wt 200 lb (90.7 kg)   SpO2 96%   BMI 37.79 kg/m    Physical Exam Vitals and nursing note reviewed.  Constitutional:      Appearance: She is well-developed.  HENT:     Head: Normocephalic and atraumatic.  Cardiovascular:     Rate and Rhythm: Normal rate and regular rhythm.     Heart sounds: Normal heart sounds.  Pulmonary:     Effort: Pulmonary effort is normal.     Breath sounds: Normal breath sounds.  Skin:    General: Skin is warm and dry.  Neurological:     Mental Status: She is alert and oriented to person, place, and time.  Psychiatric:        Behavior: Behavior normal.      Results for orders placed or performed in visit on 05/25/22  POCT glycosylated hemoglobin (Hb A1C)  Result Value Ref Range   Hemoglobin A1C 5.9 (A) 4.0 - 5.6 %   HbA1c POC (<> result, manual entry)      HbA1c, POC (prediabetic range)     HbA1c, POC (controlled diabetic range)        The 10-year ASCVD risk score (Arnett DK, et al., 2019) is: 30.8%    Assessment & Plan:   Problem List Items Addressed This Visit       Cardiovascular and Mediastinum   Essential hypertension - Primary    Well controlled. Continue current regimen. Follow up in  57mo       Relevant Orders   BASIC METABOLIC PANEL WITH GFR     Endocrine   Impaired fasting glucose    Hemoglobin A1c 5.9 today looks fantastic.  Continue current regimen.  Follow-up in 6 months.      Relevant Orders   POCT glycosylated hemoglobin (Hb A1C) (Completed)   BASIC METABOLIC PANEL WITH GFR   Other Visit Diagnoses     Sleep disturbance          We did discuss upgrading her pneumonia vaccine to Prevnar 20.  She says she will definitely do it at her next office visit since she is headed out to the beach today.  Sleep Disturbance-discussed cutting out caffeine after lunchtime may  be switching to a decaf tea.  Continue to work on good sleep hygiene.  If not improving then we can discuss further options.  Return in about 6 months (around 11/25/2022) for Pre-diabetes, Hypertension.    Beatrice Lecher, MD

## 2022-05-26 LAB — BASIC METABOLIC PANEL WITH GFR
BUN: 20 mg/dL (ref 7–25)
CO2: 31 mmol/L (ref 20–32)
Calcium: 10.1 mg/dL (ref 8.6–10.4)
Chloride: 104 mmol/L (ref 98–110)
Creat: 0.76 mg/dL (ref 0.60–1.00)
Glucose, Bld: 129 mg/dL — ABNORMAL HIGH (ref 65–99)
Potassium: 4 mmol/L (ref 3.5–5.3)
Sodium: 146 mmol/L (ref 135–146)
eGFR: 82 mL/min/{1.73_m2} (ref >=60)

## 2022-05-26 NOTE — Progress Notes (Signed)
Sorry for the confusion.,  A1c is stable still in the prediabetes range.  Metabolic panel within acceptable range as well.

## 2022-06-05 ENCOUNTER — Other Ambulatory Visit: Payer: Self-pay | Admitting: Family Medicine

## 2022-06-05 DIAGNOSIS — I10 Essential (primary) hypertension: Secondary | ICD-10-CM

## 2022-06-06 ENCOUNTER — Other Ambulatory Visit: Payer: Self-pay | Admitting: Family Medicine

## 2022-07-05 DIAGNOSIS — L814 Other melanin hyperpigmentation: Secondary | ICD-10-CM | POA: Diagnosis not present

## 2022-07-05 DIAGNOSIS — C44319 Basal cell carcinoma of skin of other parts of face: Secondary | ICD-10-CM | POA: Diagnosis not present

## 2022-07-05 DIAGNOSIS — C44519 Basal cell carcinoma of skin of other part of trunk: Secondary | ICD-10-CM | POA: Diagnosis not present

## 2022-07-05 DIAGNOSIS — Z85828 Personal history of other malignant neoplasm of skin: Secondary | ICD-10-CM | POA: Diagnosis not present

## 2022-07-05 DIAGNOSIS — L821 Other seborrheic keratosis: Secondary | ICD-10-CM | POA: Diagnosis not present

## 2022-07-05 DIAGNOSIS — L570 Actinic keratosis: Secondary | ICD-10-CM | POA: Diagnosis not present

## 2022-08-01 DIAGNOSIS — Z85828 Personal history of other malignant neoplasm of skin: Secondary | ICD-10-CM | POA: Diagnosis not present

## 2022-08-01 DIAGNOSIS — C44519 Basal cell carcinoma of skin of other part of trunk: Secondary | ICD-10-CM | POA: Diagnosis not present

## 2022-08-18 DIAGNOSIS — C441122 Basal cell carcinoma of skin of right lower eyelid, including canthus: Secondary | ICD-10-CM | POA: Diagnosis not present

## 2022-10-05 ENCOUNTER — Other Ambulatory Visit: Payer: Self-pay | Admitting: Family Medicine

## 2022-10-05 DIAGNOSIS — I10 Essential (primary) hypertension: Secondary | ICD-10-CM

## 2022-11-15 ENCOUNTER — Ambulatory Visit (INDEPENDENT_AMBULATORY_CARE_PROVIDER_SITE_OTHER): Payer: Medicare Other | Admitting: Family Medicine

## 2022-11-15 DIAGNOSIS — Z780 Asymptomatic menopausal state: Secondary | ICD-10-CM | POA: Diagnosis not present

## 2022-11-15 DIAGNOSIS — Z0000 Encounter for general adult medical examination without abnormal findings: Secondary | ICD-10-CM | POA: Diagnosis not present

## 2022-11-15 NOTE — Patient Instructions (Addendum)
MEDICARE ANNUAL WELLNESS VISIT Health Maintenance Summary and Written Plan of Care  Pam Porter ,  Thank you for allowing me to perform your Medicare Annual Wellness Visit and for your ongoing commitment to your health.   Health Maintenance & Immunization History Health Maintenance  Topic Date Due   COVID-19 Vaccine (3 - Pfizer risk series) 12/01/2022 (Originally 12/17/2019)   INFLUENZA VACCINE  05/29/2023 (Originally 09/29/2022)   DEXA SCAN  11/15/2023 (Originally 01/10/2013)   Hepatitis C Screening  11/15/2023 (Originally 01/10/1964)   Medicare Annual Wellness (AWV)  11/15/2023   MAMMOGRAM  04/08/2024   DTaP/Tdap/Td (5 - Td or Tdap) 10/30/2028   Colonoscopy  06/06/2030   Pneumonia Vaccine 27+ Years old  Completed   Zoster Vaccines- Shingrix  Completed   HPV VACCINES  Aged Out   Immunization History  Administered Date(s) Administered   Influenza Split 11/26/2009, 01/28/2011   PFIZER(Purple Top)SARS-COV-2 Vaccination 10/22/2019, 11/19/2019   Pneumococcal Conjugate-13 06/12/2013   Pneumococcal Polysaccharide-23 07/17/2014   Td 09/11/2002, 10/31/2018   Td (Adult) 09/11/2002   Tdap 06/07/2012   Zoster Recombinant(Shingrix) 06/15/2021, 10/25/2021   Zoster, Live 09/29/2009    These are the patient goals that we discussed:  Goals Addressed               This Visit's Progress     Patient Stated (pt-stated)        Patient stated she would like to loose some weight.         This is a list of Health Maintenance Items that are overdue or due now: There are no preventive care reminders to display for this patient.    Orders/Referrals Placed Today: Orders Placed This Encounter  Procedures   DEXAScan    Standing Status:   Future    Standing Expiration Date:   11/15/2023    Scheduling Instructions:     Please call patient to schedule    Order Specific Question:   Reason for exam:    Answer:   post menopausal    Order Specific Question:   Preferred imaging location?     Answer:   [name]   (Contact our referral department at [phone] if you have not spoken with someone about your referral appointment within the next 5 days)    Follow-up Plan Follow-up with Agapito Games, MD as planned Dexa scan referral sent. Their number is [phone]. Medicare wellness visit in one year.  Patient will access AVS on my chart.      Health Maintenance, Female Adopting a healthy lifestyle and getting preventive care are important in promoting health and wellness. Ask your health care provider about: The right schedule for you to have regular tests and exams. Things you can do on your own to prevent diseases and keep yourself healthy. What should I know about diet, weight, and exercise? Eat a healthy diet  Eat a diet that includes plenty of vegetables, fruits, low-fat dairy products, and lean protein. Do not eat a lot of foods that are high in solid fats, added sugars, or sodium. Maintain a healthy weight Body mass index (BMI) is used to identify weight problems. It estimates body fat based on height and weight. Your health care provider can help determine your BMI and help you achieve or maintain a healthy weight. Get regular exercise Get regular exercise. This is one of the most important things you can do for your health. Most adults should: Exercise for at least 150 minutes each week. The exercise should increase  your heart rate and make you sweat (moderate-intensity exercise). Do strengthening exercises at least twice a week. This is in addition to the moderate-intensity exercise. Spend less time sitting. Even light physical activity can be beneficial. Watch cholesterol and blood lipids Have your blood tested for lipids and cholesterol at 75 years of age, then have this test every 5 years. Have your cholesterol levels checked more often if: Your lipid or cholesterol levels are high. You are older than 75 years of age. You are at  high risk for heart disease. What should I know about cancer screening? Depending on your health history and family history, you may need to have cancer screening at various ages. This may include screening for: Breast cancer. Cervical cancer. Colorectal cancer. Skin cancer. Lung cancer. What should I know about heart disease, diabetes, and high blood pressure? Blood pressure and heart disease High blood pressure causes heart disease and increases the risk of stroke. This is more likely to develop in people who have high blood pressure readings or are overweight. Have your blood pressure checked: Every 3-5 years if you are 33-19 years of age. Every year if you are 73 years old or older. Diabetes Have regular diabetes screenings. This checks your fasting blood sugar level. Have the screening done: Once every three years after age 72 if you are at a normal weight and have a low risk for diabetes. More often and at a younger age if you are overweight or have a high risk for diabetes. What should I know about preventing infection? Hepatitis B If you have a higher risk for hepatitis B, you should be screened for this virus. Talk with your health care provider to find out if you are at risk for hepatitis B infection. Hepatitis C Testing is recommended for: Everyone born from [birth year] through 1963. Anyone with known risk factors for hepatitis C. Sexually transmitted infections (STIs) Get screened for STIs, including gonorrhea and chlamydia, if: You are sexually active and are younger than 75 years of age. You are older than 75 years of age and your health care provider tells you that you are at risk for this type of infection. Your sexual activity has changed since you were last screened, and you are at increased risk for chlamydia or gonorrhea. Ask your health care provider if you are at risk. Ask your health care provider about whether you are at high risk for HIV. Your health care provider may  recommend a prescription medicine to help prevent HIV infection. If you choose to take medicine to prevent HIV, you should first get tested for HIV. You should then be tested every 3 months for as long as you are taking the medicine. Pregnancy If you are about to stop having your period (premenopausal) and you may become pregnant, seek counseling before you get pregnant. Take 400 to 800 micrograms (mcg) of folic acid every day if you become pregnant. Ask for birth control (contraception) if you want to prevent pregnancy. Osteoporosis and menopause Osteoporosis is a disease in which the bones lose minerals and strength with aging. This can result in bone fractures. If you are 80 years old or older, or if you are at risk for osteoporosis and fractures, ask your health care provider if you should: Be screened for bone loss. Take a calcium or vitamin D supplement to lower your risk of fractures. Be given hormone replacement therapy (HRT) to treat symptoms of menopause. Follow these instructions at home: Alcohol use Do not drink  alcohol if: Your health care provider tells you not to drink. You are pregnant, may be pregnant, or are planning to become pregnant. If you drink alcohol: Limit how much you have to: 0-1 drink a day. Know how much alcohol is in your drink. In the [place] one drink equals one 12 oz bottle of beer (355 mL), one 5 oz glass of wine (148 mL), or one 1 oz glass of hard liquor (44 mL). Lifestyle Do not use any products that contain nicotine or tobacco. These products include cigarettes, chewing tobacco, and vaping devices, such as e-cigarettes. If you need help quitting, ask your health care provider. Do not use street drugs. Do not share needles. Ask your health care provider for help if you need support or information about quitting drugs. General instructions Schedule regular health, dental, and eye exams. Stay current with your vaccines. Tell your health care provider  if: You often feel depressed. You have ever been abused or do not feel safe at home. Summary Adopting a healthy lifestyle and getting preventive care are important in promoting health and wellness. Follow your health care provider's instructions about healthy diet, exercising, and getting tested or screened for diseases. Follow your health care provider's instructions on monitoring your cholesterol and blood pressure. This information is not intended to replace advice given to you by your health care provider. Make sure you discuss any questions you have with your health care provider. Document Revised: 07/06/2020 Document Reviewed: 07/06/2020 Elsevier Patient Education  2024 [employer].

## 2022-11-15 NOTE — Progress Notes (Signed)
MEDICARE ANNUAL WELLNESS VISIT  11/15/2022  Telephone Visit Disclaimer This Medicare AWV was conducted by telephone due to national recommendations for restrictions regarding the COVID-19 Pandemic (e.g. social distancing).  I verified, using two identifiers, that I am speaking with Pam Porter or their authorized healthcare agent. I discussed the limitations, risks, security, and privacy concerns of performing an evaluation and management service by telephone and the potential availability of an in-person appointment in the future. The patient expressed understanding and agreed to proceed.  Location of Patient: Home Location of Provider (nurse):  In the office.  Subjective:    Pam Porter is a 77 y.o. female patient of Metheney, Barbarann Ehlers, MD who had a Medicare Annual Wellness Visit today via telephone. Pam Porter is Retired and lives with their spouse. she has 2 children. she reports that she is socially active and does interact with friends/family regularly. she is moderately physically active and enjoys gardening, reading and travelling.  Patient Care Team: Agapito Games, MD as PCP - General (Family Medicine)     11/15/2022    1:09 PM 11/12/2021    4:10 PM 01/27/2021    6:32 AM 03/11/2019    1:00 PM 03/07/2019   10:24 AM  Advanced Directives  Does Patient Have a Medical Advance Directive? Yes Yes Yes Yes Yes  Type of Advance Directive Living will Living will Healthcare Power of [place];Living will Healthcare Power of [place];Living will Healthcare Power of [place];Living will  Does patient want to make changes to medical advance directive? No - Patient declined No - Patient declined No - Patient declined No - Patient declined No - Patient declined  Copy of Healthcare Power of Attorney in Chart?   No - copy requested No - copy requested No - copy requested  Would patient like information on creating a medical advance directive?   No - Patient declined       Hospital Utilization Over the Past 12 Months: # of hospitalizations or ER visits: 0 # of surgeries: 0  Review of Systems    Patient reports that her overall health is unchanged compared to last year.  History obtained from chart review and the patient  Patient Reported Readings (BP, Pulse, CBG, Weight, etc) none Per patient no change in vitals since last visit, unable to obtain new vitals due to telehealth visit  Pain Assessment Pain : No/denies pain     Current Medications & Allergies (verified) Allergies as of 11/15/2022       Reactions   Amoxicillin    Other reaction(s): nausea   Tape Itching, Rash   Occurred after 2-3 days of adhesive on skin.        Medication List        Accurate as of November 15, 2022  1:20 PM. If you have any questions, ask your nurse or doctor.          amLODipine 5 MG tablet Commonly known as: NORVASC TAKE 1 TABLET (5 MG TOTAL) BY MOUTH DAILY. PATIENT TAKES 1 TABLET DAILY AS NEEDED ONLY.   cholecalciferol 25 MCG (1000 UNIT) tablet Commonly known as: VITAMIN D3 Take 1,000 mcg by mouth daily.   docusate sodium 100 MG capsule Commonly known as: COLACE every evening.   hydrochlorothiazide 12.5 MG tablet Commonly known as: HYDRODIURIL TAKE 1 TABLET BY MOUTH EVERY DAY   hydrOXYzine 25 MG tablet Commonly known as: ATARAX TAKE 1 TABLET BY MOUTH EVERYDAY AT BEDTIME   losartan 50 MG tablet Commonly known as:  COZAAR TAKE 1 TABLET (50 MG) BY MOUTH IN THE MORNING AND AT BEDTIME   lovastatin 10 MG tablet Commonly known as: MEVACOR TAKE 1 TABLET BY MOUTH WITH THE EVENING MEAL FOR 30 DAYS   Magnesium 500 MG Tabs Take 500 mg by mouth at bedtime.   Turmeric Curcumin 500 MG Caps Take 1,000 mg by mouth 2 (two) times daily at 10 AM and 5 PM.        History (reviewed): Past Medical History:  Diagnosis Date   Arthritis    GERD (gastroesophageal reflux disease)    Hypertension    Skin cancer    basal cell   Past Surgical  History:  Procedure Laterality Date   BREAST EXCISIONAL BIOPSY Right 01/27/2021   BREAST LUMPECTOMY WITH RADIOACTIVE SEED LOCALIZATION Right 01/27/2021   Procedure: RIGHT BREAST LUMPECTOMY WITH RADIOACTIVE SEED LOCALIZATION;  Surgeon: Harriette Bouillon, MD;  Location: [facility];  Service: General;  Laterality: Right;   CATARACT EXTRACTION Right 11/09/2021   CATARACT EXTRACTION W/ INTRAOCULAR LENS IMPLANT Right 11/09/2021   LUMBAR LAMINECTOMY/DECOMPRESSION MICRODISCECTOMY Bilateral 03/11/2019   Procedure: LAMINECTOMY AND FORAMINOTOMY BILATERAL LUMBAR THREE- LUMBAR FOUR, LUMBAR FOUR- LUMBAR FIVE;  Surgeon: Tressie Stalker, MD;  Location: [facility] OR;  Service: Neurosurgery;  Laterality: Bilateral;  LAMINECTOMY AND FORAMINOTOMY BILATERAL LUMBAR THREE- LUMBAR FOUR, LUMBAR FOUR- LUMBAR FIVE   TOTAL ABDOMINAL HYSTERECTOMY     Family History  Problem Relation Age of Onset   Diabetes Mother    Hypertension Mother    Social History   Socioeconomic History   Marital status: Married    Spouse name: Preslie Reinsch   Number of children: 2   Years of education: 13   Highest education level: Some college, no degree  Occupational History   Occupation: Retired  Tobacco Use   Smoking status: Never   Smokeless tobacco: Never  Vaping Use   Vaping status: Never Used  Substance and Sexual Activity   Alcohol use: Never   Drug use: Never   Sexual activity: Not Currently  Other Topics Concern   Not on file  Social History Narrative   Lives with spouse. She has two sons. She enjoys travelling, gardening and reading.   Social Determinants of Health   Financial Resource Strain: Low Risk  (11/15/2022)   Overall Financial Resource Strain (CARDIA)    Difficulty of Paying Living Expenses: Not hard at all  Food Insecurity: No Food Insecurity (11/15/2022)   Hunger Vital Sign    Worried About Running Out of Food in the Last Year: Never true    Ran Out of Food in the Last Year: Never true   Transportation Needs: No Transportation Needs (11/15/2022)   PRAPARE - Administrator, Civil Service (Medical): No    Lack of Transportation (Non-Medical): No  Physical Activity: Inactive (11/15/2022)   Exercise Vital Sign    Days of Exercise per Week: 0 days    Minutes of Exercise per Session: 0 min  Stress: No Stress Concern Present (11/15/2022)   [employer] of Occupational Health - Occupational Stress Questionnaire    Feeling of Stress : Not at all  Social Connections: Moderately Integrated (11/15/2022)   Social Connection and Isolation Panel [NHANES]    Frequency of Communication with Friends and Family: More than three times a week    Frequency of Social Gatherings with Friends and Family: More than three times a week    Attends Religious Services: More than 4 times per year    Active  Member of Clubs or Organizations: No    Attends Banker Meetings: Never    Marital Status: Married    Activities of Daily Living    11/15/2022    1:13 PM  In your present state of health, do you have any difficulty performing the following activities:  Hearing? 1  Comment some right hearing loss  Vision? 0  Difficulty concentrating or making decisions? 0  Walking or climbing stairs? 0  Dressing or bathing? 0  Doing errands, shopping? 0  Preparing Food and eating ? N  Using the Toilet? N  In the past six months, have you accidently leaked urine? N  Do you have problems with loss of bowel control? N  Managing your Medications? N  Managing your Finances? N  Housekeeping or managing your Housekeeping? N    Patient Education/ Literacy How often do you need to have someone help you when you read instructions, pamphlets, or other written materials from your doctor or pharmacy?: 1 - Never What is the last grade level you completed in school?: some college  Exercise    Diet Patient reports consuming 3 meals a day and 2 snack(s) a day Patient reports that her  primary diet is: Regular Patient reports that she does have regular access to food.   Depression Screen    11/15/2022    1:09 PM 05/25/2022    8:40 AM 11/16/2021    8:47 AM 11/12/2021    4:14 PM 05/10/2021   10:28 AM 08/04/2020    2:23 PM  PHQ 2/9 Scores  PHQ - 2 Score 0 0 0 0 0 0     Fall Risk    11/15/2022    1:09 PM 05/25/2022    8:40 AM 11/16/2021    8:47 AM 11/12/2021    4:13 PM 05/10/2021   10:28 AM  Fall Risk   Falls in the past year? 0 0 1 1 0  Number falls in past yr: 0 0 0 0 0  Injury with Fall? 0 0 0 0 0  Risk for fall due to : No Fall Risks No Fall Risks No Fall Risks History of fall(s) No Fall Risks  Follow up Falls evaluation completed Falls evaluation completed Falls evaluation completed Falls evaluation completed Falls evaluation completed     Objective:  Rosea Scheurer seemed alert and oriented and she participated appropriately during our telephone visit.  Blood Pressure Weight BMI  BP Readings from Last 3 Encounters:  05/25/22 (!) 123/37  03/14/22 131/74  11/16/21 136/62   Wt Readings from Last 3 Encounters:  05/25/22 200 lb (90.7 kg)  11/16/21 202 lb 6.4 oz (91.8 kg)  11/12/21 202 lb 0.6 oz (91.6 kg)   BMI Readings from Last 1 Encounters:  05/25/22 37.79 kg/m    *Unable to obtain current vital signs, weight, and BMI due to telephone visit type  Hearing/Vision  Oluwajomiloju did not seem to have difficulty with hearing/understanding during the telephone conversation Reports that she has had a formal eye exam by an eye care professional within the past year Reports that she has not had a formal hearing evaluation within the past year *Unable to fully assess hearing and vision during telephone visit type  Cognitive Function:    11/15/2022    1:15 PM 11/12/2021    4:20 PM  6CIT Screen  What Year? 0 points 0 points  What month? 0 points 0 points  What time? 0 points 0 points  Count back from 20  0 points 0 points  Months in reverse 0 points 0 points   Repeat phrase 0 points 0 points  Total Score 0 points 0 points   (Normal:0-7, Significant for Dysfunction: >8)  Normal Cognitive Function Screening: Yes   Immunization & Health Maintenance Record Immunization History  Administered Date(s) Administered   Influenza Split 11/26/2009, 01/28/2011   PFIZER(Purple Top)SARS-COV-2 Vaccination 10/22/2019, 11/19/2019   Pneumococcal Conjugate-13 06/12/2013   Pneumococcal Polysaccharide-23 07/17/2014   Td 09/11/2002, 10/31/2018   Td (Adult) 09/11/2002   Tdap 06/07/2012   Zoster Recombinant(Shingrix) 06/15/2021, 10/25/2021   Zoster, Live 09/29/2009    Health Maintenance  Topic Date Due   COVID-19 Vaccine (3 - Pfizer risk series) 12/01/2022 (Originally 12/17/2019)   INFLUENZA VACCINE  05/29/2023 (Originally 09/29/2022)   DEXA SCAN  11/15/2023 (Originally 01/10/2013)   Hepatitis C Screening  11/15/2023 (Originally 01/10/1964)   Medicare Annual Wellness (AWV)  11/15/2023   MAMMOGRAM  04/08/2024   DTaP/Tdap/Td (5 - Td or Tdap) 10/30/2028   Colonoscopy  06/06/2030   Pneumonia Vaccine 66+ Years old  Completed   Zoster Vaccines- Shingrix  Completed   HPV VACCINES  Aged Out       Assessment  This is a routine wellness examination for [employer].  Health Maintenance: Due or Overdue There are no preventive care reminders to display for this patient.   [employer] does not need a referral for [employer] Assistance: Care Management:   no Social Work:    no Prescription Assistance:  no Nutrition/Diabetes Education:  no   Plan:  Personalized Goals  Goals Addressed               This Visit's Progress     Patient Stated (pt-stated)        Patient stated she would like to loose some weight.       Personalized Health Maintenance & Screening Recommendations  Influenza vaccine - patient declined Bone density scan  Lung Cancer Screening Recommended: no (Low Dose CT Chest recommended if Age 43-80 years, 20 pack-year  currently smoking OR have quit w/in past 15 years) Hepatitis C Screening recommended: no HIV Screening recommended: no  Advanced Directives: Written information was not prepared per patient's request.  Referrals & Orders Orders Placed This Encounter  Procedures   DEXAScan    Follow-up Plan Follow-up with Agapito Games, MD as planned Dexa scan referral sent. Their number is [phone]. Medicare wellness visit in one year.  Patient will access AVS on my chart.   I have personally reviewed and noted the following in the patient's chart:   Medical and social history Use of alcohol, tobacco or illicit drugs  Current medications and supplements Functional ability and status Nutritional status Physical activity Advanced directives List of other physicians Hospitalizations, surgeries, and ER visits in previous 12 months Vitals Screenings to include cognitive, depression, and falls Referrals and appointments  In addition, I have reviewed and discussed with Pam Porter certain preventive protocols, quality metrics, and best practice recommendations. A written personalized care plan for preventive services as well as general preventive health recommendations is available and can be mailed to the patient at her request.      Modesto Charon, RN BSN  11/15/2022

## 2022-11-25 ENCOUNTER — Ambulatory Visit (INDEPENDENT_AMBULATORY_CARE_PROVIDER_SITE_OTHER): Payer: Medicare Other | Admitting: Family Medicine

## 2022-11-25 VITALS — BP 130/78 | HR 74 | Ht 61.0 in | Wt 205.0 lb

## 2022-11-25 DIAGNOSIS — R0683 Snoring: Secondary | ICD-10-CM | POA: Diagnosis not present

## 2022-11-25 DIAGNOSIS — Z6835 Body mass index (BMI) 35.0-35.9, adult: Secondary | ICD-10-CM | POA: Insufficient documentation

## 2022-11-25 DIAGNOSIS — R0681 Apnea, not elsewhere classified: Secondary | ICD-10-CM | POA: Diagnosis not present

## 2022-11-25 DIAGNOSIS — I10 Essential (primary) hypertension: Secondary | ICD-10-CM | POA: Diagnosis not present

## 2022-11-25 DIAGNOSIS — E782 Mixed hyperlipidemia: Secondary | ICD-10-CM

## 2022-11-25 DIAGNOSIS — R7301 Impaired fasting glucose: Secondary | ICD-10-CM | POA: Diagnosis not present

## 2022-11-25 LAB — POCT GLYCOSYLATED HEMOGLOBIN (HGB A1C): Hemoglobin A1C: 6.2 % — AB (ref 4.0–5.6)

## 2022-11-25 NOTE — Assessment & Plan Note (Signed)
Due to recheck lipids today.

## 2022-11-25 NOTE — Assessment & Plan Note (Signed)
STOP-BANG questionnaire score of 6 today.  She answered yes to the first 6 questions.  Will refer for home sleep study for further evaluation.

## 2022-11-25 NOTE — Assessment & Plan Note (Signed)
Well controlled. Continue current regimen. Follow up in  6 mo  

## 2022-11-25 NOTE — Assessment & Plan Note (Signed)
Up just a little bit at 6.2.  She says she normally does little better than when her time she is more consistent with her exercise routine and going to the gym.  She was a little less consistent this summer.  Just encouraged her to get back on track.

## 2022-11-25 NOTE — Progress Notes (Signed)
Established Patient Office Visit  Subjective   Patient ID: Pam Porter, female    DOB: 10-20-1945  Age: 77 y.o. MRN: [id number]  Chief Complaint  Patient presents with   Diabetes    Follow up A1c check    HPI  Hypertension- Pt denies chest pain, SOB, dizziness, or heart palpitations.  Taking meds as directed w/o problems.  Denies medication side effects.    Impaired fasting glucose-no increased thirst or urination. No symptoms consistent with hypoglycemia.  Hyperlipidemia - tolerating stating well with no myalgias or significant side effects.  Lab Results  Component Value Date   CHOL 174 11/16/2021   HDL 63 11/16/2021   LDLCALC 93 11/16/2021   TRIG 87 11/16/2021   CHOLHDL 2.8 11/16/2021    Also reports that at night she has been waking up feeling like she is gasping for air.  She says that her husband tells her that she does snore she is not sure if it so just occasionally or nightly but has been told that it is loud she does also report feeling tired and fatigued during the daytime.  And she does have a history of hypertension as well as a BMI greater than 35 and age greater than 50.     ROS    Objective:     BP 130/78   Pulse 74   Ht 5\' 1"  (1.549 m)   Wt 205 lb (93 kg)   SpO2 97%   BMI 38.73 kg/m    Physical Exam Vitals and nursing note reviewed.  Constitutional:      Appearance: Normal appearance.  HENT:     Head: Normocephalic and atraumatic.  Eyes:     Conjunctiva/sclera: Conjunctivae normal.  Cardiovascular:     Rate and Rhythm: Normal rate and regular rhythm.  Pulmonary:     Effort: Pulmonary effort is normal.     Breath sounds: Normal breath sounds.  Skin:    General: Skin is warm and dry.  Neurological:     Mental Status: She is alert.  Psychiatric:        Mood and Affect: Mood normal.      Results for orders placed or performed in visit on 11/25/22  POCT HgB A1C  Result Value Ref Range   Hemoglobin A1C 6.2 (A) 4.0 - 5.6 %    HbA1c POC (<> result, manual entry)     HbA1c, POC (prediabetic range)     HbA1c, POC (controlled diabetic range)        The 10-year ASCVD risk score (Arnett DK, et al., 2019) is: 33.8%    Assessment & Plan:   Problem List Items Addressed This Visit       Cardiovascular and Mediastinum   Essential hypertension - Primary    Well controlled. Continue current regimen. Follow up in  6 mo       Relevant Orders   Lipid Panel With LDL/HDL Ratio   CMP14+EGFR   CBC   POCT HgB A1C (Completed)     Endocrine   Impaired fasting glucose    Up just a little bit at 6.2.  She says she normally does little better than when her time she is more consistent with her exercise routine and going to the gym.  She was a little less consistent this summer.  Just encouraged her to get back on track.      Relevant Orders   Lipid Panel With LDL/HDL Ratio   CMP14+EGFR   CBC   POCT  HgB A1C (Completed)     Other   Snoring    STOP-BANG questionnaire score of 6 today.  She answered yes to the first 6 questions.  Will refer for home sleep study for further evaluation.      Severe obesity (BMI 35.0-35.9 with comorbidity) (HCC)    Continue to work on healthy diet and regular exercise.      Hyperlipidemia    Due to recheck lipids today.      Relevant Orders   Lipid Panel With LDL/HDL Ratio   CMP14+EGFR   CBC   POCT HgB A1C (Completed)   Other Visit Diagnoses     Apnea       Relevant Orders   Home sleep test       Return in about 6 months (around 05/25/2023) for Pre-diabetes, Hypertension.    Nani Gasser, MD

## 2022-11-25 NOTE — Assessment & Plan Note (Signed)
Continue to work on healthy diet and regular exercise. ?

## 2022-11-26 LAB — LIPID PANEL WITH LDL/HDL RATIO
Cholesterol, Total: 156 mg/dL (ref 100–199)
HDL: 49 mg/dL (ref >39)
LDL Chol Calc (NIH): 88 mg/dL (ref 0–99)
LDL/HDL Ratio: 1.8 {ratio} (ref 0.0–3.2)
Triglycerides: 106 mg/dL (ref 0–149)
VLDL Cholesterol Cal: 19 mg/dL (ref 5–40)

## 2022-11-26 LAB — CBC
Hematocrit: 47.2 % — ABNORMAL HIGH (ref 34.0–46.6)
Hemoglobin: 15.8 g/dL (ref 11.1–15.9)
MCH: 29.9 pg (ref 26.6–33.0)
MCHC: 33.5 g/dL (ref 31.5–35.7)
MCV: 89 fL (ref 79–97)
Platelets: 323 10*3/uL (ref 150–450)
RBC: 5.28 x10E6/uL (ref 3.77–5.28)
RDW: 12.5 % (ref 11.7–15.4)
WBC: 6.5 10*3/uL (ref 3.4–10.8)

## 2022-11-26 LAB — CMP14+EGFR
ALT: 37 [IU]/L — ABNORMAL HIGH (ref 0–32)
AST: 28 [IU]/L (ref 0–40)
Albumin: 4.2 g/dL (ref 3.8–4.8)
Alkaline Phosphatase: 64 [IU]/L (ref 44–121)
BUN/Creatinine Ratio: 21 (ref 12–28)
BUN: 13 mg/dL (ref 8–27)
Bilirubin Total: 0.4 mg/dL (ref 0.0–1.2)
CO2: 26 mmol/L (ref 20–29)
Calcium: 9.8 mg/dL (ref 8.7–10.3)
Chloride: 100 mmol/L (ref 96–106)
Creatinine, Ser: 0.63 mg/dL (ref 0.57–1.00)
Globulin, Total: 2.1 g/dL (ref 1.5–4.5)
Glucose: 103 mg/dL — ABNORMAL HIGH (ref 70–99)
Potassium: 3.8 mmol/L (ref 3.5–5.2)
Sodium: 140 mmol/L (ref 134–144)
Total Protein: 6.3 g/dL (ref 6.0–8.5)
eGFR: 93 mL/min/{1.73_m2} (ref >59)

## 2022-11-28 NOTE — Progress Notes (Signed)
Hi Pam Porter, the ALT liver enzymes up just slightly.  Like to recheck this in about 3 weeks avoid any excess Tylenol or alcohol products.  Blood count is normal.  Cholesterol looks great.

## 2022-12-04 ENCOUNTER — Other Ambulatory Visit: Payer: Self-pay | Admitting: Family Medicine

## 2022-12-04 DIAGNOSIS — E785 Hyperlipidemia, unspecified: Secondary | ICD-10-CM

## 2022-12-10 ENCOUNTER — Other Ambulatory Visit: Payer: Self-pay | Admitting: Family Medicine

## 2022-12-21 ENCOUNTER — Ambulatory Visit: Payer: Medicare Other

## 2022-12-21 ENCOUNTER — Other Ambulatory Visit: Payer: Self-pay | Admitting: *Deleted

## 2022-12-21 DIAGNOSIS — Z780 Asymptomatic menopausal state: Secondary | ICD-10-CM

## 2022-12-21 DIAGNOSIS — Z0000 Encounter for general adult medical examination without abnormal findings: Secondary | ICD-10-CM

## 2022-12-21 DIAGNOSIS — R748 Abnormal levels of other serum enzymes: Secondary | ICD-10-CM

## 2022-12-22 LAB — HEPATIC FUNCTION PANEL
ALT: 29 [IU]/L (ref 0–32)
AST: 25 [IU]/L (ref 0–40)
Albumin: 4.2 g/dL (ref 3.8–4.8)
Alkaline Phosphatase: 69 [IU]/L (ref 44–121)
Bilirubin Total: 0.3 mg/dL (ref 0.0–1.2)
Bilirubin, Direct: 0.1 mg/dL (ref 0.00–0.40)
Total Protein: 6.4 g/dL (ref 6.0–8.5)

## 2022-12-22 NOTE — Progress Notes (Signed)
Repeat liver function is normal again!

## 2022-12-22 NOTE — Progress Notes (Signed)
Hi Pam Porter, your bone density shows your T score is -0.6 which is normal!!! .  Keep taking your vitamin D.

## 2022-12-28 ENCOUNTER — Ambulatory Visit (HOSPITAL_BASED_OUTPATIENT_CLINIC_OR_DEPARTMENT_OTHER): Payer: Medicare Other | Attending: Family Medicine | Admitting: Internal Medicine

## 2022-12-28 DIAGNOSIS — G4736 Sleep related hypoventilation in conditions classified elsewhere: Secondary | ICD-10-CM | POA: Diagnosis not present

## 2022-12-28 DIAGNOSIS — G4733 Obstructive sleep apnea (adult) (pediatric): Secondary | ICD-10-CM | POA: Insufficient documentation

## 2022-12-28 DIAGNOSIS — R0681 Apnea, not elsewhere classified: Secondary | ICD-10-CM

## 2023-01-07 DIAGNOSIS — G4733 Obstructive sleep apnea (adult) (pediatric): Secondary | ICD-10-CM

## 2023-01-07 DIAGNOSIS — R0681 Apnea, not elsewhere classified: Secondary | ICD-10-CM | POA: Diagnosis not present

## 2023-01-07 NOTE — Procedures (Signed)
    Patient Name: Pam Porter, Pam Porter Date: 12/29/2022 Gender: Female D.O.B: 05-29-1945 Age (years): 76 Referring Provider: Nani Gasser Height (inches): 61 Interpreting Physician: Jetty Duhamel MD, ABSM Weight (lbs): 205 RPSGT: [place] BMI: 39 MRN: [id number] Neck Size: <br>  CLINICAL INFORMATION Sleep Study Type: HST Indication for sleep study: OSA Epworth Sleepiness Score: 6  SLEEP STUDY TECHNIQUE A multi-channel overnight portable sleep study was performed. The channels recorded were: nasal airflow, thoracic respiratory movement, and oxygen saturation with a pulse oximetry. Snoring was also monitored.  MEDICATIONS Patient self administered medications include: none reported.  SLEEP ARCHITECTURE Patient was studied for 364.5 minutes. The sleep efficiency was 100.0 % and the patient was supine for 0%. The arousal index was 0.0 per hour.  RESPIRATORY PARAMETERS The overall AHI was 29.8 per hour, with a central apnea index of 0 per hour. The oxygen nadir was 76% during sleep.  CARDIAC DATA Mean heart rate during sleep was 67.8 bpm.  IMPRESSIONS - Moderate obstructive sleep apnea occurred during this study (AHI = 29.8/h). - Oxygen desaturation was noted during this study (Min O2 = 76%, Mean 94%). - Patient snored.  DIAGNOSIS - Obstructive Sleep Apnea (G47.33) - Nocturnal Hypoxemia (G47.36)  RECOMMENDATIONS - Suggest CPAP titration sleep study or autopap. Other options would be based on clinical judgment. - Be careful with alcohol, sedatives and other CNS depressants that may worsen sleep apnea and disrupt normal sleep architecture. - Sleep hygiene should be reviewed to assess factors that may improve sleep quality. - Weight management and regular exercise should be initiated or continued.  [Electronically signed] 01/07/2023 12:26 PM  Jetty Duhamel MD, ABSM Diplomate, American Board of Sleep Medicine NPI: [id number]                          Jetty Duhamel Diplomate, American Board of Sleep Medicine  ELECTRONICALLY SIGNED ON:  01/07/2023, 12:23 PM [facility] PH: [phone]   FX: [phone] ACCREDITED BY THE AMERICAN ACADEMY OF SLEEP MEDICINE

## 2023-01-09 ENCOUNTER — Telehealth: Payer: Self-pay | Admitting: Family Medicine

## 2023-01-09 NOTE — Telephone Encounter (Signed)
Please call patient and let her know that I did receive her sleep study results.  I would like to schedule an appointment either virtually or in person to go over the results together.

## 2023-01-17 ENCOUNTER — Ambulatory Visit (INDEPENDENT_AMBULATORY_CARE_PROVIDER_SITE_OTHER): Payer: Medicare Other | Admitting: Family Medicine

## 2023-01-17 ENCOUNTER — Encounter: Payer: Self-pay | Admitting: Family Medicine

## 2023-01-17 VITALS — BP 116/48 | HR 75 | Ht 61.0 in | Wt 205.0 lb

## 2023-01-17 DIAGNOSIS — G4733 Obstructive sleep apnea (adult) (pediatric): Secondary | ICD-10-CM | POA: Insufficient documentation

## 2023-01-17 DIAGNOSIS — Z7689 Persons encountering health services in other specified circumstances: Secondary | ICD-10-CM | POA: Insufficient documentation

## 2023-01-17 DIAGNOSIS — Z6838 Body mass index (BMI) 38.0-38.9, adult: Secondary | ICD-10-CM

## 2023-01-17 MED ORDER — BUPROPION HCL ER (XL) 150 MG PO TB24: 150.0000 mg | ORAL_TABLET | Freq: Every day | ORAL | 1 refills | Status: DC

## 2023-01-17 NOTE — Assessment & Plan Note (Addendum)
Visit #: 1 Starting Weight: 205 lbs   Current weight: 205 lbs  Previous weight: Change in weight: Goal weight: Dietary goals: work on reducing Carb intake. Only 1 carb with each meal. Avoids sugar and diet drinks.  Exercise goals: Stay active, handout given.  Medication: Wellbutrin 150mg  every day  Follow-up and referrals: 6-8 weeks on medication.

## 2023-01-17 NOTE — Progress Notes (Signed)
   Established Patient Office Visit  Subjective   Patient ID: Pam Porter, female    DOB: March 30, 1945  Age: 77 y.o. MRN: [id number]  Chief Complaint  Patient presents with   Sleep Apnea    HPI   Sleep study performed December 28, 2018 for a home sleep study.  Showed a AHI of 29.8 with an oxygen saturation noted at 76%.  Suggest CPAP titration.  She also really wants to work on weight loss.  BMI is 38 and she is down 5 pounds.  She tries to eat healthy but admits she does not always make the best choices.  Interested in weight loss medications if that would be available for her.    ROS    Objective:     BP (!) 116/48   Pulse 75   Ht 5\' 1"  (1.549 m)   Wt 205 lb (93 kg)   SpO2 97%   BMI 38.73 kg/m    Physical Exam Vitals and nursing note reviewed.  Constitutional:      Appearance: Normal appearance.  HENT:     Head: Normocephalic and atraumatic.  Eyes:     Conjunctiva/sclera: Conjunctivae normal.  Pulmonary:     Effort: Pulmonary effort is normal.  Skin:    General: Skin is warm and dry.  Neurological:     Mental Status: She is alert and oriented to person, place, and time.  Psychiatric:        Mood and Affect: Mood normal.     No results found for any visits on 01/17/23.    The 10-year ASCVD risk score (Arnett DK, et al., 2019) is: 30.6%    Assessment & Plan:   Problem List Items Addressed This Visit       Respiratory   OSA (obstructive sleep apnea) - Primary    Sleep study performed December 28, 2018 for a home sleep study. Showed a AHI of 29.8 with an oxygen saturation noted at 76%. Suggest CPAP titration   We reviewed the results in detail today and discussed the options for CPAP treatment.  Will start with CPAP and will start with nasal pillows since she does have some claustrophobia and is very concerned about wearing a fullface mask.  We also discussed the importance of weight loss.      Relevant Orders   For home use only DME  continuous positive airway pressure (CPAP)     Other   Encounter for weight management    Visit #: 1 Starting Weight: 205 lbs   Current weight: 205 lbs  Previous weight: Change in weight: Goal weight: Dietary goals: work on reducing Carb intake. Only 1 carb with each meal. Avoids sugar and diet drinks.  Exercise goals: Stay active, handout given.  Medication: Wellbutrin 150mg  every day  Follow-up and referrals: 6-8 weeks on medication.        Relevant Medications   buPROPion (WELLBUTRIN XL) 150 MG 24 hr tablet   Other Visit Diagnoses     BMI 38.0-38.9,adult       Relevant Medications   buPROPion (WELLBUTRIN XL) 150 MG 24 hr tablet       Return in about 7 weeks (around 03/07/2023) for New start CPAP.    Nani Gasser, MD

## 2023-01-17 NOTE — Assessment & Plan Note (Signed)
Sleep study performed December 28, 2018 for a home sleep study. Showed a AHI of 29.8 with an oxygen saturation noted at 76%. Suggest CPAP titration   We reviewed the results in detail today and discussed the options for CPAP treatment.  Will start with CPAP and will start with nasal pillows since she does have some claustrophobia and is very concerned about wearing a fullface mask.  We also discussed the importance of weight loss.

## 2023-02-08 ENCOUNTER — Other Ambulatory Visit: Payer: Self-pay | Admitting: Family Medicine

## 2023-02-08 DIAGNOSIS — Z6838 Body mass index (BMI) 38.0-38.9, adult: Secondary | ICD-10-CM

## 2023-02-08 DIAGNOSIS — Z7689 Persons encountering health services in other specified circumstances: Secondary | ICD-10-CM

## 2023-02-13 ENCOUNTER — Encounter: Payer: Self-pay | Admitting: Family Medicine

## 2023-02-14 DIAGNOSIS — E119 Type 2 diabetes mellitus without complications: Secondary | ICD-10-CM | POA: Diagnosis not present

## 2023-02-14 DIAGNOSIS — H43813 Vitreous degeneration, bilateral: Secondary | ICD-10-CM | POA: Diagnosis not present

## 2023-02-14 DIAGNOSIS — Z961 Presence of intraocular lens: Secondary | ICD-10-CM | POA: Diagnosis not present

## 2023-02-14 LAB — HM DIABETES EYE EXAM

## 2023-02-20 ENCOUNTER — Encounter: Payer: Self-pay | Admitting: Family Medicine

## 2023-02-26 ENCOUNTER — Other Ambulatory Visit: Payer: Self-pay

## 2023-02-26 ENCOUNTER — Ambulatory Visit
Admission: RE | Admit: 2023-02-26 | Discharge: 2023-02-26 | Disposition: A | Discharge status: Home / Self Care | Payer: Medicare Other | Source: Ambulatory Visit | Attending: Family Medicine | Admitting: Family Medicine

## 2023-02-26 VITALS — BP 165/85 | HR 96 | Temp 98.2°F | Resp 16

## 2023-02-26 DIAGNOSIS — R059 Cough, unspecified: Secondary | ICD-10-CM

## 2023-02-26 DIAGNOSIS — J069 Acute upper respiratory infection, unspecified: Secondary | ICD-10-CM

## 2023-02-26 MED ORDER — PROMETHAZINE-DM 6.25-15 MG/5ML PO SYRP: 5.0000 mL | ORAL_SOLUTION | Freq: Two times a day (BID) | ORAL | 0 refills | Status: DC | PRN

## 2023-02-26 MED ORDER — DOXYCYCLINE HYCLATE 100 MG PO CAPS: 100.0000 mg | ORAL_CAPSULE | Freq: Two times a day (BID) | ORAL | 0 refills | Status: AC

## 2023-02-26 MED ORDER — BENZONATATE 200 MG PO CAPS: 200.0000 mg | ORAL_CAPSULE | Freq: Three times a day (TID) | ORAL | 0 refills | Status: AC | PRN

## 2023-02-26 MED ORDER — PREDNISONE 20 MG PO TABS: ORAL_TABLET | ORAL | 0 refills | Status: DC

## 2023-02-26 NOTE — Discharge Instructions (Addendum)
Advised patient to take medications as directed with food to completion.  Advised patient to take prednisone with first dose of doxycycline for the next 5 of 7 days.  Advised may take Tessalon capsules daily or as needed for cough.  Advised may use Promethazine DM at night prior to sleep for cough due to sedative effects.  Encouraged to increase daily water intake to 64 ounces per day while taking these medications.  Advised if symptoms worsen and/or unresolved please follow-up with PCP or here for further evaluation.

## 2023-02-26 NOTE — ED Provider Notes (Signed)
[facility]    CSN: [id number] Arrival date & time: 02/26/23  1351      History   Chief Complaint Chief Complaint  Patient presents with   Cough    HPI Pam Porter is a 77 y.o. female.   HPI Very pleasant 77 year old female presents with cough since Thanksgiving.  She felt better for 3 days then report cough returns as a burning sensation in her chest.  Patient reports difficulty sleeping due to cough.  PMH significant for morbid obesity, OSA, and HTN.  Past Medical History:  Diagnosis Date   Arthritis    GERD (gastroesophageal reflux disease)    Hypertension    Skin cancer    basal cell    Patient Active Problem List   Diagnosis Date Noted   OSA (obstructive sleep apnea) 01/17/2023   Encounter for weight management 01/17/2023   Severe obesity (BMI 35.0-35.9 with comorbidity) (HCC) 11/25/2022   Snoring 11/25/2022   Insomnia 11/16/2021   Hyperlipidemia 11/16/2021   History of colonic polyps 05/10/2021   Other specified disorders of bone density and structure, unspecified thigh 05/10/2021   History of lumpectomy of right breast 05/10/2021   Arthritis 08/04/2020   Impaired fasting glucose 08/04/2020   Lumbar stenosis with neurogenic claudication 03/11/2019   Essential hypertension 01/18/2019    Past Surgical History:  Procedure Laterality Date   BREAST EXCISIONAL BIOPSY Right 01/27/2021   BREAST LUMPECTOMY WITH RADIOACTIVE SEED LOCALIZATION Right 01/27/2021   Procedure: RIGHT BREAST LUMPECTOMY WITH RADIOACTIVE SEED LOCALIZATION;  Surgeon: Harriette Bouillon, MD;  Location: [facility];  Service: General;  Laterality: Right;   CATARACT EXTRACTION Right 11/09/2021   CATARACT EXTRACTION W/ INTRAOCULAR LENS IMPLANT Right 11/09/2021   LUMBAR LAMINECTOMY/DECOMPRESSION MICRODISCECTOMY Bilateral 03/11/2019   Procedure: LAMINECTOMY AND FORAMINOTOMY BILATERAL LUMBAR THREE- LUMBAR FOUR, LUMBAR FOUR- LUMBAR FIVE;  Surgeon: Tressie Stalker, MD;   Location: [facility] OR;  Service: Neurosurgery;  Laterality: Bilateral;  LAMINECTOMY AND FORAMINOTOMY BILATERAL LUMBAR THREE- LUMBAR FOUR, LUMBAR FOUR- LUMBAR FIVE   TOTAL ABDOMINAL HYSTERECTOMY      OB History   No obstetric history on file.      Home Medications    Prior to Admission medications   Medication Sig Start Date End Date Taking? Authorizing Provider  benzonatate (TESSALON) 200 MG capsule Take 1 capsule (200 mg total) by mouth 3 (three) times daily as needed for up to 7 days. 02/26/23 03/05/23 Yes Trevor Iha, FNP  doxycycline (VIBRAMYCIN) 100 MG capsule Take 1 capsule (100 mg total) by mouth 2 (two) times daily for 7 days. 02/26/23 03/05/23 Yes Trevor Iha, FNP  predniSONE (DELTASONE) 20 MG tablet Take 3 tabs PO daily x 5 days. 02/26/23  Yes Trevor Iha, FNP  promethazine-dextromethorphan (PROMETHAZINE-DM) 6.25-15 MG/5ML syrup Take 5 mLs by mouth 2 (two) times daily as needed for cough. 02/26/23  Yes Trevor Iha, FNP  amLODipine (NORVASC) 5 MG tablet TAKE 1 TABLET (5 MG TOTAL) BY MOUTH DAILY. PATIENT TAKES 1 TABLET DAILY AS NEEDED ONLY. 10/05/22   Agapito Games, MD  buPROPion (WELLBUTRIN XL) 150 MG 24 hr tablet Take 1 tablet (150 mg total) by mouth daily. 02/22/23   Agapito Games, MD  cholecalciferol (VITAMIN D) 25 MCG (1000 UNIT) tablet Take 1,000 mcg by mouth daily.    [provider]  docusate sodium (COLACE) 100 MG capsule every evening.    [provider]  hydrochlorothiazide (HYDRODIURIL) 12.5 MG tablet TAKE 1 TABLET BY MOUTH EVERY DAY 12/05/22   Metheney,  Barbarann Ehlers, MD  hydrOXYzine (ATARAX) 25 MG tablet TAKE 1 TABLET BY MOUTH EVERYDAY AT BEDTIME 12/13/22   Agapito Games, MD  losartan (COZAAR) 50 MG tablet TAKE 1 TABLET (50 MG) BY MOUTH IN THE MORNING AND AT BEDTIME 06/06/22   Agapito Games, MD  lovastatin (MEVACOR) 10 MG tablet TAKE 1 TABLET BY MOUTH WITH THE EVENING MEAL FOR 30 DAYS 12/05/22   Agapito Games, MD   Magnesium 500 MG TABS Take 500 mg by mouth at bedtime.     [provider]  Turmeric Curcumin 500 MG CAPS Take 1,000 mg by mouth 2 (two) times daily at 10 AM and 5 PM.    [provider]    Family History Family History  Problem Relation Age of Onset   Diabetes Mother    Hypertension Mother     Social History Social History   Tobacco Use   Smoking status: Never   Smokeless tobacco: Never  Vaping Use   Vaping status: Never Used  Substance Use Topics   Alcohol use: Never   Drug use: Never     Allergies   Amoxicillin and Tape   Review of Systems Review of Systems  Respiratory:  Positive for cough.   All other systems reviewed and are negative.    Physical Exam Triage Vital Signs ED Triage Vitals  Encounter Vitals Group     BP 02/26/23 1403 (!) 165/85     Systolic BP Percentile --      Diastolic BP Percentile --      Pulse Rate 02/26/23 1403 96     Resp 02/26/23 1403 16     Temp 02/26/23 1403 98.2 F (36.8 C)     Temp src --      SpO2 02/26/23 1403 93 %     Weight --      Height --      Head Circumference --      Peak Flow --      Pain Score 02/26/23 1402 0     Pain Loc --      Pain Education --      Exclude from Growth Chart --    No data found.  Updated Vital Signs BP (!) 165/85   Pulse 96   Temp 98.2 F (36.8 C)   Resp 16   SpO2 93%    Physical Exam Vitals and nursing note reviewed.  Constitutional:      Appearance: Normal appearance. She is obese. She is ill-appearing.  HENT:     Head: Normocephalic and atraumatic.     Right Ear: Tympanic membrane, ear canal and external ear normal.     Left Ear: Tympanic membrane, ear canal and external ear normal.     Mouth/Throat:     Mouth: Mucous membranes are moist.     Pharynx: Oropharynx is clear.  Eyes:     Extraocular Movements: Extraocular movements intact.     Conjunctiva/sclera: Conjunctivae normal.     Pupils: Pupils are equal, round, and reactive to light.   Cardiovascular:     Rate and Rhythm: Normal rate and regular rhythm.     Pulses: Normal pulses.     Heart sounds: Normal heart sounds.  Pulmonary:     Effort: Pulmonary effort is normal.     Breath sounds: Normal breath sounds. No wheezing, rhonchi or rales.     Comments: Frequent nonproductive cough on exam Musculoskeletal:        General: Normal range of  motion.     Cervical back: Normal range of motion and neck supple.  Skin:    General: Skin is warm and dry.  Neurological:     General: No focal deficit present.     Mental Status: She is alert and oriented to person, place, and time.  Psychiatric:        Mood and Affect: Mood normal.        Behavior: Behavior normal.      UC Treatments / Results  Labs (all labs ordered are listed, but only abnormal results are displayed) Labs Reviewed - No data to display  EKG   Radiology No results found.  Procedures Procedures (including critical care time)  Medications Ordered in UC Medications - No data to display  Initial Impression / Assessment and Plan / UC Course  I have reviewed the triage vital signs and the nursing notes.  Pertinent labs & imaging results that were available during my care of the patient were reviewed by me and considered in my medical decision making (see chart for details).     MDM: 1.  Acute upper respiratory infection-Rx doxycycline 100 mg capsule: Take 1 capsule twice daily x 7 days; 2.  Cough, unspecified type Rx Tessalon 200 mg capsules 1 take 1 capsule 3 times daily, as needed for cough, Rx'd Promethazine DM 6.25-15 Mg/5 mL syrup: Take 5 mL twice daily, as needed for cough. Advised patient to take medications as directed with food to completion.  Advised patient to take prednisone with first dose of doxycycline for the next 5 of 7 days.  Advised may take Tessalon capsules daily or as needed for cough.  Advised may use Promethazine DM at night prior to sleep for cough due to sedative effects.   Encouraged to increase daily water intake to 64 ounces per day while taking these medications.  Advised if symptoms worsen and/or unresolved please follow-up with PCP or here for further evaluation. Final Clinical Impressions(s) / UC Diagnoses   Final diagnoses:  Cough, unspecified type  Acute upper respiratory infection     Discharge Instructions      Advised patient to take medications as directed with food to completion.  Advised patient to take prednisone with first dose of doxycycline for the next 5 of 7 days.  Advised may take Tessalon capsules daily or as needed for cough.  Advised may use Promethazine DM at night prior to sleep for cough due to sedative effects.  Encouraged to increase daily water intake to 64 ounces per day while taking these medications.  Advised if symptoms worsen and/or unresolved please follow-up with PCP or here for further evaluation.     ED Prescriptions     Medication Sig Dispense Auth. Provider   doxycycline (VIBRAMYCIN) 100 MG capsule Take 1 capsule (100 mg total) by mouth 2 (two) times daily for 7 days. 14 capsule Trevor Iha, FNP   predniSONE (DELTASONE) 20 MG tablet Take 3 tabs PO daily x 5 days. 15 tablet Trevor Iha, FNP   benzonatate (TESSALON) 200 MG capsule Take 1 capsule (200 mg total) by mouth 3 (three) times daily as needed for up to 7 days. 40 capsule Trevor Iha, FNP   promethazine-dextromethorphan (PROMETHAZINE-DM) 6.25-15 MG/5ML syrup Take 5 mLs by mouth 2 (two) times daily as needed for cough. 118 mL Trevor Iha, FNP      PDMP not reviewed this encounter.   Trevor Iha, FNP 02/26/23 1440

## 2023-02-26 NOTE — ED Triage Notes (Signed)
Pt presents to uc with co of cough since thanksgiving. Pt reports she felt better for 3 days then she reports cough returned and a burning sensation in chest. Pt reports she has been taking musinex and otc cold and flu medication. Difficulty sleeping.

## 2023-03-09 ENCOUNTER — Ambulatory Visit (INDEPENDENT_AMBULATORY_CARE_PROVIDER_SITE_OTHER): Payer: Medicare Other

## 2023-03-09 ENCOUNTER — Ambulatory Visit (INDEPENDENT_AMBULATORY_CARE_PROVIDER_SITE_OTHER): Payer: Medicare Other | Admitting: Family Medicine

## 2023-03-09 ENCOUNTER — Encounter: Payer: Self-pay | Admitting: Family Medicine

## 2023-03-09 VITALS — BP 136/58 | HR 90 | Ht 61.0 in | Wt 205.0 lb

## 2023-03-09 DIAGNOSIS — R052 Subacute cough: Secondary | ICD-10-CM

## 2023-03-09 DIAGNOSIS — R051 Acute cough: Secondary | ICD-10-CM | POA: Diagnosis not present

## 2023-03-09 DIAGNOSIS — Z6838 Body mass index (BMI) 38.0-38.9, adult: Secondary | ICD-10-CM | POA: Diagnosis not present

## 2023-03-09 DIAGNOSIS — G4733 Obstructive sleep apnea (adult) (pediatric): Secondary | ICD-10-CM

## 2023-03-09 DIAGNOSIS — R059 Cough, unspecified: Secondary | ICD-10-CM

## 2023-03-09 LAB — POC COVID19 BINAXNOW: SARS Coronavirus 2 Ag: NEGATIVE

## 2023-03-09 MED ORDER — PREDNISONE 20 MG PO TABS: 40.0000 mg | ORAL_TABLET | Freq: Every day | ORAL | 0 refills | Status: DC

## 2023-03-09 MED ORDER — FLUTICASONE PROPIONATE 50 MCG/ACT NA SUSP: 2.0000 | Freq: Every day | NASAL | 1 refills | Status: DC

## 2023-03-09 MED ORDER — OMEPRAZOLE 20 MG PO CPDR: 20.0000 mg | DELAYED_RELEASE_CAPSULE | Freq: Two times a day (BID) | ORAL | 0 refills | Status: DC

## 2023-03-09 MED ORDER — HYDROCODONE BIT-HOMATROP MBR 5-1.5 MG/5ML PO SOLN: 5.0000 mL | Freq: Every evening | ORAL | 0 refills | Status: DC | PRN

## 2023-03-09 MED ORDER — AZITHROMYCIN 250 MG PO TABS: ORAL_TABLET | ORAL | 0 refills | Status: AC

## 2023-03-09 NOTE — Progress Notes (Signed)
 Acute Office Visit  Subjective:     Patient ID: Pam Porter, female    DOB: Jul 07, 1945, 78 y.o.   MRN: [id number]  Chief Complaint  Patient presents with   Cough    Sxs off/on since thanksgiving     HPI Patient is in today for cough.  Seen in UC on 12/29 after cough x 1 months. Given doxy and prednisone . Felt better but feels like sxs are coming back.  He says its mostly a cough.  No sinus congestion.  That she feels like she has a band across her forehead.  No fevers chills or sweats.  Had a lot of pressure in her forehead and her facial cheeks bilaterally as well as some postnasal drip but she is not blowing out any drainage.  ROS      Objective:    BP (!) 136/58   Pulse 90   Ht 5' 1 (1.549 m)   Wt 205 lb (93 kg)   SpO2 94%   BMI 38.73 kg/m    Physical Exam Vitals and nursing note reviewed.  Constitutional:      Appearance: Normal appearance.  HENT:     Head: Normocephalic and atraumatic.     Right Ear: Tympanic membrane, ear canal and external ear normal. There is no impacted cerumen.     Left Ear: Tympanic membrane, ear canal and external ear normal. There is no impacted cerumen.     Nose: Nose normal.     Mouth/Throat:     Pharynx: Oropharynx is clear.  Eyes:     Conjunctiva/sclera: Conjunctivae normal.  Cardiovascular:     Rate and Rhythm: Normal rate and regular rhythm.  Pulmonary:     Effort: Pulmonary effort is normal.     Breath sounds: Normal breath sounds.     Comments: Coarse BS bilat at the bases.  Musculoskeletal:     Cervical back: Neck supple. No tenderness.  Lymphadenopathy:     Cervical: No cervical adenopathy.  Skin:    General: Skin is warm and dry.  Neurological:     Mental Status: She is alert and oriented to person, place, and time.  Psychiatric:        Mood and Affect: Mood normal.     Results for orders placed or performed in visit on 03/09/23  POC COVID-19  Result Value Ref Range   SARS Coronavirus 2 Ag Negative  Negative        Assessment & Plan:   Problem List Items Addressed This Visit       Respiratory   OSA (obstructive sleep apnea)   Still hasn't heard back on CPAP. Sent note in [facility] portal as still says processing.        Other Visit Diagnoses       Acute cough    -  Primary   Relevant Orders   POC COVID-19 (Completed)     BMI 38.0-38.9,adult         Subacute cough       Relevant Medications   predniSONE  (DELTASONE ) 20 MG tablet   omeprazole  (PRILOSEC) 20 MG capsule   fluticasone  (FLONASE ) 50 MCG/ACT nasal spray   HYDROcodone  bit-homatropine (HYCODAN) 5-1.5 MG/5ML syrup   azithromycin  (ZITHROMAX ) 250 MG tablet   Other Relevant Orders   DG Chest 2 View   Bordetella pertussis antibody   Bordetella pertussis PCR       For approximately 7 weeks at this point she did get some mild improvement after antibiotics and  prednisone  but it never completely went away and symptoms got worse about 5 days ago again.  We discussed treating with azithromycin  and prednisone  will get a chest x-ray to rule out other underlying causes.  I am also going to put her on a PPI to reduce any GERD that might be aggravating a postinfectious cough as well as Flonase  to help with the show pressure that she has been experiencing.  Will aggressively treat to try to get her improved.  Will also check for pertussis.  Meds ordered this encounter  Medications   predniSONE  (DELTASONE ) 20 MG tablet    Sig: Take 2 tablets (40 mg total) by mouth daily with breakfast.    Dispense:  10 tablet    Refill:  0   omeprazole  (PRILOSEC) 20 MG capsule    Sig: Take 1 capsule (20 mg total) by mouth 2 (two) times daily before a meal.    Dispense:  60 capsule    Refill:  0   fluticasone  (FLONASE ) 50 MCG/ACT nasal spray    Sig: Place 2 sprays into both nostrils daily.    Dispense:  16 g    Refill:  1   HYDROcodone  bit-homatropine (HYCODAN) 5-1.5 MG/5ML syrup    Sig: Take 5 mLs by mouth at bedtime as needed for cough.     Dispense:  60 mL    Refill:  0   azithromycin  (ZITHROMAX ) 250 MG tablet    Sig: 2 Ttabs PO on Day 1, then one a day x 4 days.    Dispense:  6 tablet    Refill:  0    Return in about 5 weeks (around 04/13/2023) for CPAP f/u .  [name], MD

## 2023-03-09 NOTE — Assessment & Plan Note (Signed)
 Still hasn't heard back on CPAP. Sent note in [facility] portal as still says processing.

## 2023-03-10 NOTE — Telephone Encounter (Signed)
 Tonya, ordered we find out in the portal.  I know you put a additional request in yesterday for them to take a look at everything.

## 2023-03-11 LAB — BORDETELLA PERTUSSIS ANTIBODY
B pertussis IgA Ab, Quant: <1 {index} (ref 0.0–0.9)
B pertussis IgG Ab: 1.3 {index} — ABNORMAL HIGH (ref 0.00–0.94)
B pertussis IgM Ab, Quant: <1 {index} (ref 0.0–0.9)

## 2023-03-13 ENCOUNTER — Encounter: Payer: Self-pay | Admitting: Family Medicine

## 2023-03-13 ENCOUNTER — Other Ambulatory Visit: Payer: Self-pay | Admitting: Family Medicine

## 2023-03-13 DIAGNOSIS — R052 Subacute cough: Secondary | ICD-10-CM

## 2023-03-13 LAB — BORDETELLA PERTUSSIS PCR
B. parapertussis DNA: NEGATIVE
B. pertussis DNA: NEGATIVE

## 2023-03-13 MED ORDER — DOXYCYCLINE HYCLATE 100 MG PO TABS: 100.0000 mg | ORAL_TABLET | Freq: Two times a day (BID) | ORAL | 0 refills | Status: DC

## 2023-03-13 NOTE — Progress Notes (Signed)
 Hi Salley, I sent in a new antibiotic for you today.  The good news is is that your chest looks good no sign of pneumonia.  Some of this may just be residual cough that may take a couple of weeks to go away but am hoping that the second round of antibiotics will help.

## 2023-03-13 NOTE — Progress Notes (Signed)
 You are negative for pertussis.

## 2023-03-15 NOTE — Telephone Encounter (Signed)
 OK please update patient.

## 2023-03-29 ENCOUNTER — Other Ambulatory Visit: Payer: Self-pay | Admitting: Family Medicine

## 2023-03-29 DIAGNOSIS — I10 Essential (primary) hypertension: Secondary | ICD-10-CM

## 2023-03-31 ENCOUNTER — Other Ambulatory Visit: Payer: Self-pay | Admitting: Family Medicine

## 2023-03-31 DIAGNOSIS — R052 Subacute cough: Secondary | ICD-10-CM

## 2023-04-11 ENCOUNTER — Encounter: Payer: Self-pay | Admitting: Family Medicine

## 2023-04-13 ENCOUNTER — Ambulatory Visit: Payer: Medicare Other | Admitting: Family Medicine

## 2023-05-10 ENCOUNTER — Other Ambulatory Visit: Payer: Self-pay | Admitting: Family Medicine

## 2023-05-10 DIAGNOSIS — Z0000 Encounter for general adult medical examination without abnormal findings: Secondary | ICD-10-CM

## 2023-05-12 ENCOUNTER — Other Ambulatory Visit: Payer: Self-pay

## 2023-05-12 ENCOUNTER — Ambulatory Visit
Admission: RE | Admit: 2023-05-12 | Discharge: 2023-05-12 | Disposition: A | Discharge status: Home / Self Care | Source: Ambulatory Visit | Attending: Family Medicine | Admitting: Family Medicine

## 2023-05-12 VITALS — BP 174/82 | HR 98 | Temp 100.1°F | Resp 16

## 2023-05-12 DIAGNOSIS — U071 COVID-19: Secondary | ICD-10-CM

## 2023-05-12 LAB — POCT INFLUENZA A/B
Influenza A, POC: NEGATIVE
Influenza B, POC: NEGATIVE

## 2023-05-12 LAB — POC SARS CORONAVIRUS 2 AG -  ED: SARS Coronavirus 2 Ag: POSITIVE — AB

## 2023-05-12 MED ORDER — PAXLOVID (300/100) 20 X 150 MG & 10 X 100MG PO TBPK: 3.0000 | ORAL_TABLET | Freq: Two times a day (BID) | ORAL | 0 refills | Status: AC

## 2023-05-12 NOTE — ED Provider Notes (Signed)
 [facility]    CSN: [id number] Arrival date & time: 05/12/23  1331     History   Chief Complaint Chief Complaint  Patient presents with   Fever    cough, runny and stuffy nose, 101 fever since last night - Entered by patient    HPI Pam Porter is a 78 y.o. female.   Fever Patient presents today with a two day history of fever, headache, rhinorrhea, cough, body aches and chills. She has been resting for symptom  management and reports yesterday temporarily she felt better until this morning symptoms worsen. On arrival patient is febrile with a temperature of 100.1.  Denies any known sick sick contact.  She denies any shortness of breath or chest pain.  She has not taken any over-the-counter medicines.  Past Medical History:  Diagnosis Date   Arthritis    GERD (gastroesophageal reflux disease)    Hypertension    Skin cancer    basal cell    Patient Active Problem List   Diagnosis Date Noted   OSA (obstructive sleep apnea) 01/17/2023   Encounter for weight management 01/17/2023   Severe obesity (BMI 35.0-35.9 with comorbidity) (HCC) 11/25/2022   Snoring 11/25/2022   Insomnia 11/16/2021   Hyperlipidemia 11/16/2021   History of colonic polyps 05/10/2021   Other specified disorders of bone density and structure, unspecified thigh 05/10/2021   History of lumpectomy of right breast 05/10/2021   Arthritis 08/04/2020   Impaired fasting glucose 08/04/2020   Lumbar stenosis with neurogenic claudication 03/11/2019   Essential hypertension 01/18/2019    Past Surgical History:  Procedure Laterality Date   BREAST EXCISIONAL BIOPSY Right 01/27/2021   BREAST LUMPECTOMY WITH RADIOACTIVE SEED LOCALIZATION Right 01/27/2021   Procedure: RIGHT BREAST LUMPECTOMY WITH RADIOACTIVE SEED LOCALIZATION;  Surgeon: Harriette Bouillon, MD;  Location: [facility];  Service: General;  Laterality: Right;   CATARACT EXTRACTION Right 11/09/2021   CATARACT EXTRACTION W/  INTRAOCULAR LENS IMPLANT Right 11/09/2021   LUMBAR LAMINECTOMY/DECOMPRESSION MICRODISCECTOMY Bilateral 03/11/2019   Procedure: LAMINECTOMY AND FORAMINOTOMY BILATERAL LUMBAR THREE- LUMBAR FOUR, LUMBAR FOUR- LUMBAR FIVE;  Surgeon: Tressie Stalker, MD;  Location: [facility];  Service: Neurosurgery;  Laterality: Bilateral;  LAMINECTOMY AND FORAMINOTOMY BILATERAL LUMBAR THREE- LUMBAR FOUR, LUMBAR FOUR- LUMBAR FIVE   TOTAL ABDOMINAL HYSTERECTOMY      OB History   No obstetric history on file.      Home Medications    Prior to Admission medications   Medication Sig Start Date End Date Taking? Authorizing Provider  nirmatrelvir/ritonavir (PAXLOVID, 300/100,) 20 x 150 MG & 10 x 100MG  TBPK Take 3 tablets by mouth 2 (two) times daily for 5 days. Patient GFR is 93. Take nirmatrelvir (150 mg) two tablets twice daily for 5 days and ritonavir (100 mg) one tablet twice daily for 5 days. 05/12/23 05/17/23 Yes Bing Neighbors, NP  amLODipine (NORVASC) 5 MG tablet TAKE 1 TABLET (5 MG TOTAL) BY MOUTH DAILY. PATIENT TAKES 1 TABLET DAILY AS NEEDED ONLY. 03/29/23   Agapito Games, MD  buPROPion (WELLBUTRIN XL) 150 MG 24 hr tablet Take 1 tablet (150 mg total) by mouth daily. 02/22/23   Agapito Games, MD  cholecalciferol (VITAMIN D) 25 MCG (1000 UNIT) tablet Take 1,000 mcg by mouth daily.    [provider]  docusate sodium (COLACE) 100 MG capsule every evening.    [provider]  doxycycline (VIBRA-TABS) 100 MG tablet Take 1 tablet (100 mg total) by mouth 2 (two) times daily.  03/13/23   Agapito Games, MD  fluticasone (FLONASE) 50 MCG/ACT nasal spray SPRAY 2 SPRAYS INTO EACH NOSTRIL EVERY DAY 04/03/23   Agapito Games, MD  hydrochlorothiazide (HYDRODIURIL) 12.5 MG tablet TAKE 1 TABLET BY MOUTH EVERY DAY 12/05/22   Agapito Games, MD  HYDROcodone bit-homatropine (HYCODAN) 5-1.5 MG/5ML syrup Take 5 mLs by mouth at bedtime as needed for cough. 03/09/23   Agapito Games, MD  hydrOXYzine (ATARAX) 25 MG tablet TAKE 1 TABLET BY MOUTH EVERYDAY AT BEDTIME 12/13/22   Agapito Games, MD  losartan (COZAAR) 50 MG tablet TAKE 1 TABLET (50 MG) BY MOUTH IN THE MORNING AND AT BEDTIME 06/06/22   Agapito Games, MD  lovastatin (MEVACOR) 10 MG tablet TAKE 1 TABLET BY MOUTH WITH THE EVENING MEAL FOR 30 DAYS 12/05/22   Agapito Games, MD  Magnesium 500 MG TABS Take 500 mg by mouth at bedtime.     [provider]  omeprazole (PRILOSEC) 20 MG capsule TAKE 1 CAPSULE (20 MG TOTAL) BY MOUTH 2 (TWO) TIMES DAILY BEFORE A MEAL. 04/03/23   Agapito Games, MD  predniSONE (DELTASONE) 20 MG tablet Take 2 tablets (40 mg total) by mouth daily with breakfast. 03/09/23   Agapito Games, MD  Turmeric Curcumin 500 MG CAPS Take 1,000 mg by mouth 2 (two) times daily at 10 AM and 5 PM.    [provider]    Family History Family History  Problem Relation Age of Onset   Diabetes Mother    Hypertension Mother     Social History Social History   Tobacco Use   Smoking status: Never   Smokeless tobacco: Never  Vaping Use   Vaping status: Never Used  Substance Use Topics   Alcohol use: Never   Drug use: Never     Allergies   Amoxicillin and Tape   Review of Systems Review of Systems  Constitutional:  Positive for fever.     Physical Exam Triage Vital Signs ED Triage Vitals  Encounter Vitals Group     BP 05/12/23 1347 (!) 174/82     Systolic BP Percentile --      Diastolic BP Percentile --      Pulse Rate 05/12/23 1347 98     Resp 05/12/23 1347 16     Temp 05/12/23 1347 100.1 F (37.8 C)     Temp Source 05/12/23 1347 Oral     SpO2 05/12/23 1347 93 %     Weight --      Height --      Head Circumference --      Peak Flow --      Pain Score 05/12/23 1350 0     Pain Loc --      Pain Education --      Exclude from Growth Chart --    No data found.  Updated Vital Signs BP (!) 174/82   Pulse 98   Temp 100.1 F (37.8 C)  (Oral)   Resp 16   SpO2 93%   Visual Acuity Right Eye Distance:   Left Eye Distance:   Bilateral Distance:    Right Eye Near:   Left Eye Near:    Bilateral Near:     Physical Exam Constitutional:      Appearance: She is well-developed. She is ill-appearing.  HENT:     Head: Normocephalic and atraumatic.     Right Ear: Tympanic membrane and ear canal normal.  Left Ear: Tympanic membrane and ear canal normal.     Nose: Congestion and rhinorrhea present.     Mouth/Throat:     Mouth: No oral lesions.     Pharynx: Uvula midline. Postnasal drip present. No pharyngeal swelling, oropharyngeal exudate, posterior oropharyngeal erythema or uvula swelling.     Tonsils: No tonsillar exudate or tonsillar abscesses. 0 on the right. 0 on the left.  Eyes:     Conjunctiva/sclera: Conjunctivae normal.     Pupils: Pupils are equal, round, and reactive to light.  Neck:     Thyroid: No thyromegaly.     Trachea: No tracheal deviation.  Cardiovascular:     Rate and Rhythm: Normal rate and regular rhythm.     Heart sounds: Normal heart sounds.  Pulmonary:     Effort: Pulmonary effort is normal.     Breath sounds: Normal breath sounds. Decreased air movement present.  Abdominal:     General: Bowel sounds are normal. There is no distension.     Palpations: Abdomen is soft.     Tenderness: There is no abdominal tenderness.  Musculoskeletal:     Cervical back: Normal range of motion and neck supple.  Lymphadenopathy:     Cervical: No cervical adenopathy.  Skin:    General: Skin is warm and dry.     Capillary Refill: Capillary refill takes less than 2 seconds.  Neurological:     General: No focal deficit present.     Mental Status: She is alert and oriented to person, place, and time.  Psychiatric:        Mood and Affect: Mood normal.        Behavior: Behavior normal.        Thought Content: Thought content normal.        Judgment: Judgment normal.      UC Treatments / Results   Labs (all labs ordered are listed, but only abnormal results are displayed) Labs Reviewed  POC SARS CORONAVIRUS 2 AG -  ED - Abnormal; Notable for the following components:      Result Value   SARS Coronavirus 2 Ag Positive (*)    All other components within normal limits  POCT INFLUENZA A/B    EKG   Radiology No results found.  Procedures Procedures (including critical care time)  Medications Ordered in UC Medications - No data to display  Initial Impression / Assessment and Plan / UC Course  I have reviewed the triage vital signs and the nursing notes.  Pertinent labs & imaging results that were available during my care of the patient were reviewed by me and considered in my medical decision making (see chart for details).   Patient positive for COVID-19 via antigen testing here in clinic.  Patient is a candidate for Paxlovid discussed risk versus benefits patient has opted for treatment.  Patient's renal function is greater than 60 she will be prescribed any regular dosage of medication.  Patient advised to take over-the-counter medication for management of cough and congestion.  Hydrate well with fluids.  Patient was also advised to hold her statin medication for  6 days including 1 day after completing Paxlovid.  Patient is aware that if any of her symptoms become severe to go immediately to the emergency department.  Patient verbalized understanding and agreement with plan.  Final Clinical Impressions(s) / UC Diagnoses   Final diagnoses:  COVID-19 virus infection     Discharge Instructions      Hold Lovastatin for 5  days while taking Paxlovid due to risk of increased the levels of lovastatin when taken with Paxlovid. Continue hydroxyzine for nasal symptoms. Take ibuprofen or Tylenol as needed for fever and this will also help improve the body aches.  Force fluids to prevent dehydration. If you are experiencing any of breath, blood glucose readings greater than 300,  or any chest tightness go immediately to the emergency department. If your symptoms are not improving within 7 days return for reevaluation or follow-up with PCP.     ED Prescriptions     Medication Sig Dispense Auth. Provider   nirmatrelvir/ritonavir (PAXLOVID, 300/100,) 20 x 150 MG & 10 x 100MG  TBPK Take 3 tablets by mouth 2 (two) times daily for 5 days. Patient GFR is 93. Take nirmatrelvir (150 mg) two tablets twice daily for 5 days and ritonavir (100 mg) one tablet twice daily for 5 days. 30 tablet Bing Neighbors, NP      PDMP not reviewed this encounter.   Bing Neighbors, NP 05/16/23 Windell Moment

## 2023-05-12 NOTE — Discharge Instructions (Addendum)
 Hold Lovastatin for 5 days while taking Paxlovid due to risk of increased the levels of lovastatin when taken with Paxlovid. Continue hydroxyzine for nasal symptoms. Take ibuprofen or Tylenol as needed for fever and this will also help improve the body aches.  Force fluids to prevent dehydration. If you are experiencing any of breath, blood glucose readings greater than 300, or any chest tightness go immediately to the emergency department. If your symptoms are not improving within 7 days return for reevaluation or follow-up with PCP.

## 2023-05-12 NOTE — ED Triage Notes (Signed)
 Started with post nasal drip and cough Wednesday night. Thursday started having fever, aches, chills, ha. No otc medications.

## 2023-05-15 ENCOUNTER — Ambulatory Visit

## 2023-05-24 ENCOUNTER — Ambulatory Visit
Admission: RE | Admit: 2023-05-24 | Discharge: 2023-05-24 | Disposition: A | Discharge status: Home / Self Care | Source: Ambulatory Visit | Attending: Family Medicine | Admitting: Family Medicine

## 2023-05-24 DIAGNOSIS — Z0000 Encounter for general adult medical examination without abnormal findings: Secondary | ICD-10-CM

## 2023-05-24 DIAGNOSIS — Z1231 Encounter for screening mammogram for malignant neoplasm of breast: Secondary | ICD-10-CM | POA: Diagnosis not present

## 2023-05-25 ENCOUNTER — Ambulatory Visit (INDEPENDENT_AMBULATORY_CARE_PROVIDER_SITE_OTHER): Payer: Medicare Other | Admitting: Family Medicine

## 2023-05-25 ENCOUNTER — Encounter: Payer: Self-pay | Admitting: Family Medicine

## 2023-05-25 VITALS — BP 141/49 | HR 73 | Ht 61.0 in | Wt 210.2 lb

## 2023-05-25 DIAGNOSIS — G4733 Obstructive sleep apnea (adult) (pediatric): Secondary | ICD-10-CM | POA: Diagnosis not present

## 2023-05-25 DIAGNOSIS — E870 Hyperosmolality and hypernatremia: Secondary | ICD-10-CM

## 2023-05-25 DIAGNOSIS — I10 Essential (primary) hypertension: Secondary | ICD-10-CM

## 2023-05-25 DIAGNOSIS — R7301 Impaired fasting glucose: Secondary | ICD-10-CM

## 2023-05-25 DIAGNOSIS — Z6839 Body mass index (BMI) 39.0-39.9, adult: Secondary | ICD-10-CM

## 2023-05-25 DIAGNOSIS — E1165 Type 2 diabetes mellitus with hyperglycemia: Secondary | ICD-10-CM | POA: Diagnosis not present

## 2023-05-25 LAB — POCT GLYCOSYLATED HEMOGLOBIN (HGB A1C): Hemoglobin A1C: 6.7 % — AB (ref 4.0–5.6)

## 2023-05-25 MED ORDER — TRULICITY 0.75 MG/0.5ML ~~LOC~~ SOAJ
0.7500 mg | SUBCUTANEOUS | 0 refills | Status: DC
Start: 2023-05-25 — End: 2023-07-05

## 2023-05-25 MED ORDER — TRULICITY 1.5 MG/0.5ML ~~LOC~~ SOAJ: 1.5000 mg | SUBCUTANEOUS | 0 refills | Status: DC

## 2023-05-25 MED ORDER — AMBULATORY NON FORMULARY MEDICATION: 1 refills | Status: AC

## 2023-05-25 NOTE — Progress Notes (Signed)
 Established Patient Office Visit  Subjective  Patient ID: Pam Porter, female    DOB: 1945/11/30  Age: 77 y.o. MRN: [id number]  Chief Complaint  Patient presents with   ifg    HPI  Impaired fasting glucose-no increased thirst or urination. No symptoms consistent with hypoglycemia.  Hypertension- Pt denies chest pain, SOB, dizziness, or heart palpitations.  Taking meds as directed w/o problems.  Denies medication side effects.    She was recently diagnosed with COVID on March 14 and was seen in urgent care.  Follow Up sleep apnea she was supposed to get new CPAP machine in February.  Sleep study performed December 28, 2018 for a home sleep study.  Showed a AHI of 29.8 with an oxygen saturation noted at 76%.  Suggest CPAP titration.     ROS    Objective:     BP (!) 141/49 (BP Location: Left Arm, Patient Position: Sitting, Cuff Size: Large)   Pulse 73   Ht 5\' 1"  (1.549 m)   Wt 210 lb 4 oz (95.4 kg)   SpO2 98%   BMI 39.73 kg/m    Physical Exam Vitals and nursing note reviewed.  Constitutional:      Appearance: Normal appearance.  HENT:     Head: Normocephalic and atraumatic.  Eyes:     Conjunctiva/sclera: Conjunctivae normal.  Cardiovascular:     Rate and Rhythm: Normal rate and regular rhythm.  Pulmonary:     Effort: Pulmonary effort is normal.     Breath sounds: Normal breath sounds.  Skin:    General: Skin is warm and dry.  Neurological:     Mental Status: She is alert.  Psychiatric:        Mood and Affect: Mood normal.      Results for orders placed or performed in visit on 05/25/23  POCT HgB A1C  Result Value Ref Range   Hemoglobin A1C 6.7 (A) 4.0 - 5.6 %   HbA1c POC (<> result, manual entry)     HbA1c, POC (prediabetic range)     HbA1c, POC (controlled diabetic range)        The 10-year ASCVD risk score (Arnett DK, et al., 2019) is: 41.9%    Assessment & Plan:   Problem List Items Addressed This Visit       Cardiovascular and  Mediastinum   Essential hypertension   Initial BP elevated today.  Continue to work on healthy diet and regular exercise will recheck blood pressure before she goes today we can make an adjustment if needed.  Normally blood pressure is a little bit better than it is today.      Relevant Orders   TSH   CMP14+EGFR     Respiratory   OSA (obstructive sleep apnea)   She finally got her CPAP about a month ago she has been wearing the nasal pillows.  She was having a lot of leaks with the large ones so switched to the small size and it is better but she still having quite a few leaks.  On average she is wearing the CPAP about 6 hours and 51 minutes and average leaks are between 20-35 her AHI is around 1.1 which is fantastic she is wondering if she might benefit from a full facemask but will need to have a prescription sent to the DME supplier.  Rx sent.        Relevant Medications   AMBULATORY NON FORMULARY MEDICATION     Endocrine   Type  2 diabetes mellitus with hyperglycemia (HCC) - Primary   A1c went up from 6.2-6.7.  We discussed we are at the point of needing medication.  She would prefer something that would also help her with her weight she continues to gain weight even though she feels like she is not eating differently her hips keep her from exercising as much as she would like but she still trying to walk for 30 minutes a day.  She is up another 5 pounds from when I saw her about 4 weeks ago.      Relevant Medications   Dulaglutide (TRULICITY) 0.75 MG/0.5ML SOAJ   Dulaglutide (TRULICITY) 1.5 MG/0.5ML SOAJ (Start on 06/15/2023)   Other Relevant Orders   TSH   CMP14+EGFR     Other   Severe obesity (BMI 35.0-39.9) with comorbidity (HCC)   She is doing a great job staying active even though there her hips limit how long she can exercise at 1 time she she has to stop and rest every 10 to 15 minutes but she is trying to be consistent with it continue to work on healthy diet.  I do no  check her thyroid function.      Relevant Medications   Dulaglutide (TRULICITY) 0.75 MG/0.5ML SOAJ   Dulaglutide (TRULICITY) 1.5 MG/0.5ML SOAJ (Start on 06/15/2023)    Return in about 3 months (around 08/25/2023) for Diabetes follow-up.    Nani Gasser, MD

## 2023-05-25 NOTE — Assessment & Plan Note (Signed)
 She is doing a great job staying active even though there her hips limit how long she can exercise at 1 time she she has to stop and rest every 10 to 15 minutes but she is trying to be consistent with it continue to work on healthy diet.  I do no check her thyroid function.

## 2023-05-25 NOTE — Assessment & Plan Note (Addendum)
 She finally got her CPAP about a month ago she has been wearing the nasal pillows.  She was having a lot of leaks with the large ones so switched to the small size and it is better but she still having quite a few leaks.  On average she is wearing the CPAP about 6 hours and 51 minutes and average leaks are between 20-35 her AHI is around 1.1 which is fantastic she is wondering if she might benefit from a full facemask but will need to have a prescription sent to the DME supplier.  Rx sent.

## 2023-05-25 NOTE — Assessment & Plan Note (Signed)
 A1c went up from 6.2-6.7.  We discussed we are at the point of needing medication.  She would prefer something that would also help her with her weight she continues to gain weight even though she feels like she is not eating differently her hips keep her from exercising as much as she would like but she still trying to walk for 30 minutes a day.  She is up another 5 pounds from when I saw her about 4 weeks ago.

## 2023-05-25 NOTE — Assessment & Plan Note (Signed)
 Initial BP elevated today.  Continue to work on healthy diet and regular exercise will recheck blood pressure before she goes today we can make an adjustment if needed.  Normally blood pressure is a little bit better than it is today.

## 2023-05-26 ENCOUNTER — Encounter: Payer: Self-pay | Admitting: Family Medicine

## 2023-05-26 LAB — CMP14+EGFR
ALT: 27 IU/L (ref 0–32)
AST: 24 IU/L (ref 0–40)
Albumin: 4.2 g/dL (ref 3.8–4.8)
Alkaline Phosphatase: 75 IU/L (ref 44–121)
BUN/Creatinine Ratio: 22 (ref 12–28)
BUN: 17 mg/dL (ref 8–27)
Bilirubin Total: 0.3 mg/dL (ref 0.0–1.2)
CO2: 25 mmol/L (ref 20–29)
Calcium: 9.9 mg/dL (ref 8.7–10.3)
Chloride: 103 mmol/L (ref 96–106)
Creatinine, Ser: 0.76 mg/dL (ref 0.57–1.00)
Globulin, Total: 2.1 g/dL (ref 1.5–4.5)
Glucose: 124 mg/dL — ABNORMAL HIGH (ref 70–99)
Potassium: 4.4 mmol/L (ref 3.5–5.2)
Sodium: 145 mmol/L — ABNORMAL HIGH (ref 134–144)
Total Protein: 6.3 g/dL (ref 6.0–8.5)
eGFR: 82 mL/min/{1.73_m2} (ref >59)

## 2023-05-26 LAB — TSH: TSH: 1.62 u[IU]/mL (ref 0.450–4.500)

## 2023-05-26 NOTE — Progress Notes (Signed)
 Please call patient. Normal mammogram.  Repeat in 1 year.

## 2023-05-26 NOTE — Addendum Note (Signed)
 Addended by: Nani Gasser D on: 05/26/2023 10:47 AM   Modules accepted: Orders

## 2023-05-26 NOTE — Progress Notes (Signed)
 HI Pam Porter, your sodium was just slightly elevated. Not sure why so lets recheck in 2 weeks.  Lets check a urine sample at that time as well. Thyroid is normal   Orders Placed This Encounter     TSH     CMP14+EGFR     Sodium     Urine Microalbumin w/creat. ratio     Urinalysis, Routine w reflex microscopic     POCT HgB A1C

## 2023-06-05 ENCOUNTER — Encounter: Payer: Self-pay | Admitting: Family Medicine

## 2023-06-07 ENCOUNTER — Other Ambulatory Visit: Payer: Self-pay | Admitting: Family Medicine

## 2023-06-07 DIAGNOSIS — I10 Essential (primary) hypertension: Secondary | ICD-10-CM

## 2023-06-08 ENCOUNTER — Other Ambulatory Visit: Payer: Self-pay | Admitting: Family Medicine

## 2023-06-12 ENCOUNTER — Telehealth: Payer: Self-pay

## 2023-06-12 DIAGNOSIS — G4733 Obstructive sleep apnea (adult) (pediatric): Secondary | ICD-10-CM

## 2023-06-12 NOTE — Telephone Encounter (Signed)
Order sent to [facility].

## 2023-06-12 NOTE — Telephone Encounter (Signed)
 Copied from CRM [id number]. Topic: Clinical - Prescription Issue >> Jun 12, 2023  9:40 AM Pam Porter wrote: Reason for CRM: Patient wants to let Dr. Greer Leak know that [facility] says they didn't receive request for face mask for her cPAP.

## 2023-06-12 NOTE — Telephone Encounter (Signed)
 Pam Porter, we can reprint from March 27 and resubmit.

## 2023-06-14 NOTE — Telephone Encounter (Signed)
 Rx for face mask sent via [facility]

## 2023-06-15 DIAGNOSIS — R7301 Impaired fasting glucose: Secondary | ICD-10-CM | POA: Diagnosis not present

## 2023-06-15 DIAGNOSIS — E1165 Type 2 diabetes mellitus with hyperglycemia: Secondary | ICD-10-CM | POA: Diagnosis not present

## 2023-06-15 DIAGNOSIS — I10 Essential (primary) hypertension: Secondary | ICD-10-CM | POA: Diagnosis not present

## 2023-06-15 DIAGNOSIS — E870 Hyperosmolality and hypernatremia: Secondary | ICD-10-CM | POA: Diagnosis not present

## 2023-06-16 LAB — URINALYSIS, ROUTINE W REFLEX MICROSCOPIC
Bilirubin, UA: NEGATIVE
Glucose, UA: NEGATIVE
Ketones, UA: NEGATIVE
Leukocytes,UA: NEGATIVE
Nitrite, UA: NEGATIVE
Protein,UA: NEGATIVE
RBC, UA: NEGATIVE
Specific Gravity, UA: 1.009 (ref 1.005–1.030)
Urobilinogen, Ur: 0.2 mg/dL (ref 0.2–1.0)
pH, UA: 6 (ref 5.0–7.5)

## 2023-06-16 LAB — MICROALBUMIN / CREATININE URINE RATIO
Creatinine, Urine: 119.6 mg/dL
Microalb/Creat Ratio: 7 mg/g{creat} (ref 0–29)
Microalbumin, Urine: 7.9 ug/mL

## 2023-06-16 LAB — SODIUM: Sodium: 143 mmol/L (ref 134–144)

## 2023-06-16 NOTE — Progress Notes (Signed)
 Hi Pam Porter, your sodium is back to normal. Still awaiting urine protein test.

## 2023-06-18 NOTE — Progress Notes (Signed)
 Hi Zykeria your urine looks good

## 2023-07-01 ENCOUNTER — Other Ambulatory Visit: Payer: Self-pay | Admitting: Family Medicine

## 2023-07-01 DIAGNOSIS — E1165 Type 2 diabetes mellitus with hyperglycemia: Secondary | ICD-10-CM

## 2023-07-10 DIAGNOSIS — D1801 Hemangioma of skin and subcutaneous tissue: Secondary | ICD-10-CM | POA: Diagnosis not present

## 2023-07-10 DIAGNOSIS — L820 Inflamed seborrheic keratosis: Secondary | ICD-10-CM | POA: Diagnosis not present

## 2023-07-10 DIAGNOSIS — L821 Other seborrheic keratosis: Secondary | ICD-10-CM | POA: Diagnosis not present

## 2023-07-10 DIAGNOSIS — C44319 Basal cell carcinoma of skin of other parts of face: Secondary | ICD-10-CM | POA: Diagnosis not present

## 2023-07-10 DIAGNOSIS — Z85828 Personal history of other malignant neoplasm of skin: Secondary | ICD-10-CM | POA: Diagnosis not present

## 2023-07-10 DIAGNOSIS — L578 Other skin changes due to chronic exposure to nonionizing radiation: Secondary | ICD-10-CM | POA: Diagnosis not present

## 2023-07-10 DIAGNOSIS — L814 Other melanin hyperpigmentation: Secondary | ICD-10-CM | POA: Diagnosis not present

## 2023-07-14 ENCOUNTER — Other Ambulatory Visit: Payer: Self-pay | Admitting: Family Medicine

## 2023-07-14 DIAGNOSIS — E1165 Type 2 diabetes mellitus with hyperglycemia: Secondary | ICD-10-CM

## 2023-07-19 ENCOUNTER — Other Ambulatory Visit: Payer: Self-pay | Admitting: Family Medicine

## 2023-07-19 DIAGNOSIS — E1165 Type 2 diabetes mellitus with hyperglycemia: Secondary | ICD-10-CM

## 2023-08-01 DIAGNOSIS — K219 Gastro-esophageal reflux disease without esophagitis: Secondary | ICD-10-CM | POA: Diagnosis not present

## 2023-08-01 DIAGNOSIS — Z860101 Personal history of adenomatous and serrated colon polyps: Secondary | ICD-10-CM | POA: Diagnosis not present

## 2023-08-01 DIAGNOSIS — K5900 Constipation, unspecified: Secondary | ICD-10-CM | POA: Diagnosis not present

## 2023-08-21 ENCOUNTER — Other Ambulatory Visit: Payer: Self-pay | Admitting: Family Medicine

## 2023-08-21 DIAGNOSIS — E1165 Type 2 diabetes mellitus with hyperglycemia: Secondary | ICD-10-CM

## 2023-08-24 ENCOUNTER — Encounter: Payer: Self-pay | Admitting: Family Medicine

## 2023-08-24 ENCOUNTER — Ambulatory Visit: Admitting: Family Medicine

## 2023-08-24 VITALS — BP 131/57 | HR 81 | Ht 61.0 in | Wt 202.0 lb

## 2023-08-24 DIAGNOSIS — I10 Essential (primary) hypertension: Secondary | ICD-10-CM | POA: Diagnosis not present

## 2023-08-24 DIAGNOSIS — G4733 Obstructive sleep apnea (adult) (pediatric): Secondary | ICD-10-CM | POA: Diagnosis not present

## 2023-08-24 DIAGNOSIS — M25512 Pain in left shoulder: Secondary | ICD-10-CM

## 2023-08-24 DIAGNOSIS — E1165 Type 2 diabetes mellitus with hyperglycemia: Secondary | ICD-10-CM | POA: Diagnosis not present

## 2023-08-24 LAB — POCT GLYCOSYLATED HEMOGLOBIN (HGB A1C): Hemoglobin A1C: 5.5 % (ref 4.0–5.6)

## 2023-08-24 MED ORDER — DULAGLUTIDE 3 MG/0.5ML ~~LOC~~ SOAJ: 3.0000 mg | SUBCUTANEOUS | 0 refills | Status: DC

## 2023-08-24 NOTE — Progress Notes (Signed)
 Established Patient Office Visit  Subjective  Patient ID: Pam Porter, female    DOB: 03-Feb-1946  Age: 78 y.o. MRN: [id number]  Chief Complaint  Patient presents with   Diabetes    HPI F/U  HTN, CM and OSA  She stopped her hydrochlorothiazide  bc read that it contraindicated with the Trulicity . She has lost about 8 pounds but feels like she is plateaued she really noticed the biggest shift in her weight for the first month that she started it.  Her left outer shoulder has been painful and bothering her the last couple of weeks.  She had bursitis in that shoulder about 4 or 5 years ago and ended up getting an injection in it at the orthopedist office.  She has been lifting a heavy beachchair but otherwise denies any trauma or injury she is able to sleep on that shoulder without significant difficulty.    ROS    Objective:     BP (!) 131/57   Pulse 81   Ht 5' 1 (1.549 m)   Wt 202 lb 0.6 oz (91.6 kg)   SpO2 98%   BMI 38.18 kg/m    Physical Exam Vitals and nursing note reviewed.  Constitutional:      Appearance: Normal appearance.  HENT:     Head: Normocephalic and atraumatic.   Eyes:     Conjunctiva/sclera: Conjunctivae normal.    Cardiovascular:     Rate and Rhythm: Normal rate and regular rhythm.  Pulmonary:     Effort: Pulmonary effort is normal.     Breath sounds: Normal breath sounds.   Skin:    General: Skin is warm and dry.   Neurological:     Mental Status: She is alert.   Psychiatric:        Mood and Affect: Mood normal.      Results for orders placed or performed in visit on 08/24/23  HM DIABETES EYE EXAM  Result Value Ref Range   HM Diabetic Eye Exam No Retinopathy No Retinopathy  Results for orders placed or performed in visit on 08/24/23  POCT HgB A1C  Result Value Ref Range   Hemoglobin A1C 5.5 4.0 - 5.6 %   HbA1c POC (<> result, manual entry)     HbA1c, POC (prediabetic range)     HbA1c, POC (controlled diabetic range)         The 10-year ASCVD risk score (Arnett DK, et al., 2019) is: 37.3%    Assessment & Plan:   Problem List Items Addressed This Visit       Cardiovascular and Mediastinum   Essential hypertension   She stopped the hydrochlorothiazide  12.5 mg.  Blood pressures have actually been okay overall we discussed that as long as it is staying under 130 we will just continue to monitor and we can hold on the medication.  Continue amlodipine  and losartan  for now.        Respiratory   OSA (obstructive sleep apnea)   Doing great. Using it for 6-8 hours.  Has enough supplies.  She is doing great with her weight loss as well she is down 8 pounds and I think if we continue to go up on her Trulicity  she will continue to lose weight and that may actually improve her sleep apnea as well.  Will try to get a download off the machine to make sure that her AHI is adequate with treatment.        Endocrine   Type 2 diabetes  mellitus with hyperglycemia (HCC) - Primary   She has been tolerating the Trulicity  well she says she did will have a couple of days where her bowels move a little slower so she doubles up on her stool softener on those days and she has had a little bit more reflux at night so she will just use the Tums as needed she is not actively taking her omeprazole  but does have a prescription at home.      Relevant Medications   Dulaglutide  3 MG/0.5ML SOAJ   Other Relevant Orders   POCT HgB A1C (Completed)   Other Visit Diagnoses       Acute pain of left shoulder          She is gena schedule with Dr. Curtis our sports med doc for her left shoulder.  Could be a recurrence of her bursitis that she has had before.   Return in about 3 months (around 11/24/2023) for Diabetes follow-up.    [name], MD

## 2023-08-24 NOTE — Assessment & Plan Note (Signed)
 She has been tolerating the Trulicity  well she says she did will have a couple of days where her bowels move a little slower so she doubles up on her stool softener on those days and she has had a little bit more reflux at night so she will just use the Tums as needed she is not actively taking her omeprazole  but does have a prescription at home.

## 2023-08-24 NOTE — Assessment & Plan Note (Addendum)
 She stopped the hydrochlorothiazide  12.5 mg.  Blood pressures have actually been okay overall we discussed that as long as it is staying under 130 we will just continue to monitor and we can hold on the medication.  Continue amlodipine  and losartan  for now.

## 2023-08-24 NOTE — Progress Notes (Signed)
 Pt stopped taking the hydrochlorothiazide  because she read on the package insert for Trulicity  that the two medications should not be taken together.   She has been doing well with the Trulicity . She has been out since last Friday. She was taking the 1.5 mg. She did experience some constipation and reflux. She said that by [facility] after taking her weekly shot on Friday. She said that she would have to really double up on her stool softener to help with this and the reflux would come on at night so she would have to take Tums.

## 2023-08-24 NOTE — Assessment & Plan Note (Addendum)
 Doing great. Using it for 6-8 hours.  Has enough supplies.  She is doing great with her weight loss as well she is down 8 pounds and I think if we continue to go up on her Trulicity  she will continue to lose weight and that may actually improve her sleep apnea as well.  Will try to get a download off the machine to make sure that her AHI is adequate with treatment.

## 2023-09-17 ENCOUNTER — Other Ambulatory Visit: Payer: Self-pay | Admitting: Family Medicine

## 2023-09-17 DIAGNOSIS — I10 Essential (primary) hypertension: Secondary | ICD-10-CM

## 2023-09-20 DIAGNOSIS — K644 Residual hemorrhoidal skin tags: Secondary | ICD-10-CM | POA: Diagnosis not present

## 2023-09-20 DIAGNOSIS — Z09 Encounter for follow-up examination after completed treatment for conditions other than malignant neoplasm: Secondary | ICD-10-CM | POA: Diagnosis not present

## 2023-09-20 DIAGNOSIS — D125 Benign neoplasm of sigmoid colon: Secondary | ICD-10-CM | POA: Diagnosis not present

## 2023-09-20 DIAGNOSIS — K648 Other hemorrhoids: Secondary | ICD-10-CM | POA: Diagnosis not present

## 2023-09-20 DIAGNOSIS — D120 Benign neoplasm of cecum: Secondary | ICD-10-CM | POA: Diagnosis not present

## 2023-09-20 DIAGNOSIS — K5730 Diverticulosis of large intestine without perforation or abscess without bleeding: Secondary | ICD-10-CM | POA: Diagnosis not present

## 2023-09-20 DIAGNOSIS — Z860101 Personal history of adenomatous and serrated colon polyps: Secondary | ICD-10-CM | POA: Diagnosis not present

## 2023-09-20 LAB — HM COLONOSCOPY

## 2023-09-22 DIAGNOSIS — D120 Benign neoplasm of cecum: Secondary | ICD-10-CM | POA: Diagnosis not present

## 2023-09-22 DIAGNOSIS — D125 Benign neoplasm of sigmoid colon: Secondary | ICD-10-CM | POA: Diagnosis not present

## 2023-09-25 ENCOUNTER — Encounter: Payer: Self-pay | Admitting: Family Medicine

## 2023-11-10 ENCOUNTER — Other Ambulatory Visit: Payer: Self-pay | Admitting: Family Medicine

## 2023-11-10 DIAGNOSIS — E1165 Type 2 diabetes mellitus with hyperglycemia: Secondary | ICD-10-CM

## 2023-11-21 ENCOUNTER — Ambulatory Visit (INDEPENDENT_AMBULATORY_CARE_PROVIDER_SITE_OTHER): Payer: Medicare Other

## 2023-11-21 VITALS — Ht 62.0 in | Wt 198.0 lb

## 2023-11-21 DIAGNOSIS — Z0000 Encounter for general adult medical examination without abnormal findings: Secondary | ICD-10-CM | POA: Diagnosis not present

## 2023-11-21 NOTE — Patient Instructions (Signed)
  Ms. Deerman , Thank you for taking time to come for your Medicare Wellness Visit. I appreciate your ongoing commitment to your health goals. Please review the following plan we discussed and let me know if I can assist you in the future.   These are the goals we discussed:  Goals       Patient Stated (pt-stated)      She would like to loose 10 lbs.      Patient Stated (pt-stated)      Patient stated she would like to loose some weight.      Patient Stated      Patient states she would like to lose more weight.         This is a list of the screening recommended for you and due dates:  Health Maintenance  Topic Date Due   Hepatitis C Screening  Never done   COVID-19 Vaccine (3 - Pfizer risk series) 12/17/2019   Flu Shot  09/29/2023   Eye exam for diabetics  02/14/2024   Hemoglobin A1C  02/23/2024   Yearly kidney function blood test for diabetes  05/24/2024   Yearly kidney health urinalysis for diabetes  06/14/2024   Complete foot exam   08/23/2024   Medicare Annual Wellness Visit  11/20/2024   DTaP/Tdap/Td vaccine (5 - Td or Tdap) 10/30/2028   Colon Cancer Screening  09/19/2033   Pneumococcal Vaccine for age over 27  Completed   DEXA scan (bone density measurement)  Completed   Zoster (Shingles) Vaccine  Completed   HPV Vaccine  Aged Out   Meningitis B Vaccine  Aged Out   Breast Cancer Screening  Discontinued

## 2023-11-21 NOTE — Progress Notes (Signed)
 Subjective:   Pam Porter is a 78 y.o. female who presents for Medicare Annual (Subsequent) preventive examination.  Visit Complete: Virtual I connected with  Sherrilyn Tharon Flake on 11/21/23 by a audio enabled telemedicine application and verified that I am speaking with the correct person using two identifiers.  Patient Location: Home  Provider Location: Office/Clinic  I discussed the limitations of evaluation and management by telemedicine. The patient expressed understanding and agreed to proceed.  Vital Signs: Because this visit was a virtual/telehealth visit, some criteria may be missing or patient reported. Any vitals not documented were not able to be obtained and vitals that have been documented are patient reported.  Patient Medicare AWV questionnaire was completed by the patient on n/a; I have confirmed that all information answered by patient is correct and no changes since this date.  Cardiac Risk Factors include: advanced age (>66men, >52 women);hypertension;obesity (BMI >30kg/m2)     Objective:    Today's Vitals   11/21/23 1424  Weight: 198 lb (89.8 kg)  Height: 5' 2 (1.575 m)   Body mass index is 36.21 kg/m.     11/21/2023    2:30 PM 11/15/2022    1:09 PM 11/12/2021    4:10 PM 01/27/2021    6:32 AM 03/11/2019    1:00 PM 03/07/2019   10:24 AM  Advanced Directives  Does Patient Have a Medical Advance Directive? Yes Yes Yes Yes Yes Yes  Type of Estate agent of [place];Living will Living will Living will Healthcare Power of [place];Living will Healthcare Power of [place];Living will Healthcare Power of [place];Living will  Does patient want to make changes to medical advance directive? No - Patient declined No - Patient declined No - Patient declined No - Patient declined No - Patient declined No - Patient declined  Copy of Healthcare Power of Attorney in Chart?    No - copy requested No - copy requested No - copy requested  Would  patient like information on creating a medical advance directive?    No - Patient declined      Current Medications (verified) Outpatient Encounter Medications as of 11/21/2023  Medication Sig   AMBULATORY NON FORMULARY MEDICATION Medication Name: full face mask  for her CPAP. Dx OSA   amLODipine  (NORVASC ) 5 MG tablet Take 1 tablet (5 mg total) by mouth daily. Patient takes 1 tablet daily as needed only.   cholecalciferol (VITAMIN D) 25 MCG (1000 UNIT) tablet Take 1,000 mcg by mouth daily.   docusate sodium  (COLACE) 100 MG capsule every evening.   Dulaglutide  (TRULICITY ) 3 MG/0.5ML SOAJ INJECT 3 MG INTO THE SKIN ONE TIME PER WEEK   hydrOXYzine  (ATARAX ) 25 MG tablet TAKE 1 TABLET BY MOUTH EVERYDAY AT BEDTIME   losartan  (COZAAR ) 50 MG tablet TAKE 1 TABLET (50 MG) BY MOUTH IN THE MORNING AND AT BEDTIME   lovastatin (MEVACOR) 10 MG tablet TAKE 1 TABLET BY MOUTH WITH THE EVENING MEAL FOR 30 DAYS   Magnesium 500 MG TABS Take 500 mg by mouth at bedtime.    Turmeric Curcumin 500 MG CAPS Take 1,000 mg by mouth 2 (two) times daily at 10 AM and 5 PM.   No facility-administered encounter medications on file as of 11/21/2023.    Allergies (verified) Amoxicillin and Tape   History: Past Medical History:  Diagnosis Date   Arthritis    GERD (gastroesophageal reflux disease)    Hypertension    Skin cancer    basal cell   Past  Surgical History:  Procedure Laterality Date   BREAST EXCISIONAL BIOPSY Right 01/27/2021   BREAST LUMPECTOMY WITH RADIOACTIVE SEED LOCALIZATION Right 01/27/2021   Procedure: RIGHT BREAST LUMPECTOMY WITH RADIOACTIVE SEED LOCALIZATION;  Surgeon: Vanderbilt Ned, MD;  Location: [facility];  Service: General;  Laterality: Right;   CATARACT EXTRACTION Right 11/09/2021   CATARACT EXTRACTION W/ INTRAOCULAR LENS IMPLANT Right 11/09/2021   LUMBAR LAMINECTOMY/DECOMPRESSION MICRODISCECTOMY Bilateral 03/11/2019   Procedure: LAMINECTOMY AND FORAMINOTOMY BILATERAL LUMBAR  THREE- LUMBAR FOUR, LUMBAR FOUR- LUMBAR FIVE;  Surgeon: Mavis Purchase, MD;  Location: [facility];  Service: Neurosurgery;  Laterality: Bilateral;  LAMINECTOMY AND FORAMINOTOMY BILATERAL LUMBAR THREE- LUMBAR FOUR, LUMBAR FOUR- LUMBAR FIVE   TOTAL ABDOMINAL HYSTERECTOMY     Family History  Problem Relation Age of Onset   Diabetes Mother    Hypertension Mother    Social History   Socioeconomic History   Marital status: Married    Spouse name: Araya Roel   Number of children: 2   Years of education: 13   Highest education level: Some college, no degree  Occupational History   Occupation: Retired  Tobacco Use   Smoking status: Never   Smokeless tobacco: Never  Vaping Use   Vaping status: Never Used  Substance and Sexual Activity   Alcohol use: Never   Drug use: Never   Sexual activity: Not Currently  Other Topics Concern   Not on file  Social History Narrative   Lives with spouse. She has two sons. She enjoys travelling, gardening and reading.   Social Drivers of Corporate investment banker Strain: Low Risk  (11/21/2023)   Overall Financial Resource Strain (CARDIA)    Difficulty of Paying Living Expenses: Not hard at all  Food Insecurity: No Food Insecurity (11/21/2023)   Hunger Vital Sign    Worried About Running Out of Food in the Last Year: Never true    Ran Out of Food in the Last Year: Never true  Transportation Needs: No Transportation Needs (11/21/2023)   PRAPARE - Administrator, Civil Service (Medical): No    Lack of Transportation (Non-Medical): No  Physical Activity: Insufficiently Active (11/21/2023)   Exercise Vital Sign    Days of Exercise per Week: 7 days    Minutes of Exercise per Session: 20 min  Stress: No Stress Concern Present (11/21/2023)   [employer] of Occupational Health - Occupational Stress Questionnaire    Feeling of Stress: Not at all  Social Connections: Moderately Integrated (11/21/2023)   Social Connection and Isolation Panel     Frequency of Communication with Friends and Family: More than three times a week    Frequency of Social Gatherings with Friends and Family: More than three times a week    Attends Religious Services: More than 4 times per year    Active Member of [employer] or Organizations: No    Attends Engineer, structural: Never    Marital Status: Married    Tobacco Counseling Counseling given: Not Answered   Clinical Intake:  Pre-visit preparation completed: Yes  Pain : No/denies pain     BMI - recorded: 36.21 Nutritional Status: BMI > 30  Obese Nutritional Risks: None Diabetes: No  How often do you need to have someone help you when you read instructions, pamphlets, or other written materials from your doctor or pharmacy?: 1 - Never What is the last grade level you completed in school?: 13  Interpreter Needed?: No      Activities of  Daily Living    11/21/2023    2:25 PM  In your present state of health, do you have any difficulty performing the following activities:  Hearing? 1  Vision? 0  Difficulty concentrating or making decisions? 0  Walking or climbing stairs? 0  Dressing or bathing? 0  Doing errands, shopping? 0  Preparing Food and eating ? N  Using the Toilet? N  In the past six months, have you accidently leaked urine? N  Do you have problems with loss of bowel control? N  Managing your Medications? N  Managing your Finances? N  Housekeeping or managing your Housekeeping? N    Patient Care Team: Alvan Dorothyann BIRCH, MD as PCP - General (Family Medicine) Saintclair Jasper, MD as Consulting Physician (Gastroenterology)  Indicate any recent Medical Services you may have received from other than [facility] providers in the past year (date may be approximate).     Assessment:   This is a routine wellness examination for Pam Porter.  Hearing/Vision screen No results found.   Goals Addressed             This Visit's Progress    Patient Stated       Patient  states she would like to lose more weight.        Depression Screen    11/21/2023    2:29 PM 11/15/2022    1:09 PM 05/25/2022    8:40 AM 11/16/2021    8:47 AM 11/12/2021    4:14 PM 05/10/2021   10:28 AM 08/04/2020    2:23 PM  PHQ 2/9 Scores  PHQ - 2 Score 0 0 0 0 0 0 0    Fall Risk    11/21/2023    2:30 PM 11/15/2022    1:09 PM 05/25/2022    8:40 AM 11/16/2021    8:47 AM 11/12/2021    4:13 PM  Fall Risk   Falls in the past year? 0 0 0 1 1  Number falls in past yr: 0 0 0 0 0  Injury with Fall? 0 0 0 0 0  Risk for fall due to : No Fall Risks No Fall Risks No Fall Risks No Fall Risks History of fall(s)  Follow up Falls evaluation completed Falls evaluation completed Falls evaluation completed Falls evaluation completed  Falls evaluation completed      Data saved with a previous flowsheet row definition    MEDICARE RISK AT HOME: Medicare Risk at Home Any stairs in or around the home?: Yes If so, are there any without handrails?: No Home free of loose throw rugs in walkways, pet beds, electrical cords, etc?: Yes Adequate lighting in your home to reduce risk of falls?: Yes Life alert?: No Use of a cane, walker or w/c?: No Grab bars in the bathroom?: Yes Shower chair or bench in shower?: Yes Elevated toilet seat or a handicapped toilet?: No  TIMED UP AND GO:  Was the test performed?  No    Cognitive Function:        11/21/2023    2:31 PM 11/15/2022    1:15 PM 11/12/2021    4:20 PM  6CIT Screen  What Year? 0 points 0 points 0 points  What month? 0 points 0 points 0 points  What time? 0 points 0 points 0 points  Count back from 20 0 points 0 points 0 points  Months in reverse 0 points 0 points 0 points  Repeat phrase 0 points 0 points 0 points  Total Score  0 points 0 points 0 points    Immunizations Immunization History  Administered Date(s) Administered   Fluzone Influenza virus vaccine,trivalent (IIV3), split virus 11/26/2009, 01/28/2011   PFIZER(Purple  Top)SARS-COV-2 Vaccination 10/22/2019, 11/19/2019   Pneumococcal Conjugate-13 06/12/2013   Pneumococcal Polysaccharide-23 07/17/2014   Td 09/11/2002, 10/31/2018   Td (Adult) 09/11/2002   Tdap 06/07/2012   Zoster Recombinant(Shingrix) 06/15/2021, 10/25/2021   Zoster, Live 09/29/2009    TDAP status: Up to date  Flu Vaccine status: Due, Education has been provided regarding the importance of this vaccine. Advised may receive this vaccine at local pharmacy or Health Dept. Aware to provide a copy of the vaccination record if obtained from local pharmacy or Health Dept. Verbalized acceptance and understanding.  Pneumococcal vaccine status: Up to date  Covid-19 vaccine status: Declined, Education has been provided regarding the importance of this vaccine but patient still declined. Advised may receive this vaccine at local pharmacy or Health Dept.or vaccine clinic. Aware to provide a copy of the vaccination record if obtained from local pharmacy or Health Dept. Verbalized acceptance and understanding.  Qualifies for Shingles Vaccine? Yes   Zostavax completed Yes   Shingrix Completed?: Yes  Screening Tests Health Maintenance  Topic Date Due   Hepatitis C Screening  Never done   COVID-19 Vaccine (3 - Pfizer risk series) 12/17/2019   Influenza Vaccine  09/29/2023   OPHTHALMOLOGY EXAM  02/14/2024   HEMOGLOBIN A1C  02/23/2024   Diabetic kidney evaluation - eGFR measurement  05/24/2024   Diabetic kidney evaluation - Urine ACR  06/14/2024   FOOT EXAM  08/23/2024   Medicare Annual Wellness (AWV)  11/20/2024   DTaP/Tdap/Td (5 - Td or Tdap) 10/30/2028   Colonoscopy  09/19/2033   Pneumococcal Vaccine: 50+ Years  Completed   DEXA SCAN  Completed   Zoster Vaccines- Shingrix  Completed   HPV VACCINES  Aged Out   Meningococcal B Vaccine  Aged Out   Mammogram  Discontinued    Health Maintenance  Health Maintenance Due  Topic Date Due   Hepatitis C Screening  Never done   COVID-19 Vaccine (3  - Pfizer risk series) 12/17/2019   Influenza Vaccine  09/29/2023    Colorectal cancer screening: Type of screening: Colonoscopy. Completed 09/20/2023. Repeat every 1 years  Mammogram status: Completed 05/24/2023. Repeat every year  Bone Density status: Completed 12/21/2022. Results reflect: Bone density results: NORMAL. Repeat every 10 years.  Lung Cancer Screening: (Low Dose CT Chest recommended if Age 31-80 years, 20 pack-year currently smoking OR have quit w/in 15years.) does not qualify.   Lung Cancer Screening Referral: n/a  Additional Screening:  Hepatitis C Screening: does not qualify; Completed    Vision Screening: Recommended annual ophthalmology exams for early detection of glaucoma and other disorders of the eye. Is the patient up to date with their annual eye exam?  Yes  Who is the provider or what is the name of the office in which the patient attends annual eye exams? Dr Arloa If pt is not established with a provider, would they like to be referred to a provider to establish care? N/a.   Dental Screening: Recommended annual dental exams for proper oral hygiene  Diabetic Foot Exam: Diabetic Foot Exam: Completed 08/24/2023  Community Resource Referral / Chronic Care Management: CRR required this visit?  No   CCM required this visit?  No     Plan:     I have personally reviewed and noted the following in the patient's chart:   Medical  and social history Use of alcohol, tobacco or illicit drugs  Current medications and supplements including opioid prescriptions. Patient is not currently taking opioid prescriptions. Functional ability and status Nutritional status Physical activity Advanced directives List of other physicians Hospitalizations, surgeries, and ER visits in previous 12 months. None Vitals Screenings to include cognitive, depression, and falls Referrals and appointments  In addition, I have reviewed and discussed with patient certain preventive  protocols, quality metrics, and best practice recommendations. A written personalized care plan for preventive services as well as general preventive health recommendations were provided to patient.     Bonny Jon Mayor, [place]   11/21/2023   After Visit Summary: (MyChart) Due to this being a telephonic visit, the after visit summary with patients personalized plan was offered to patient via MyChart   Nurse Notes:   Pam Porter is a 78 y.o. female patient of Metheney, Dorothyann BIRCH, MD who had a Medicare Annual Wellness Visit today via telephone. Pam Porter is Retired and lives with their spouse. She has 2 children. She reports that she is socially active and does interact with friends/family regularly. She is moderately physically active and enjoys gardening, reading and travelling.

## 2023-11-26 ENCOUNTER — Other Ambulatory Visit: Payer: Self-pay | Admitting: Family Medicine

## 2023-11-26 DIAGNOSIS — E785 Hyperlipidemia, unspecified: Secondary | ICD-10-CM

## 2023-11-29 ENCOUNTER — Encounter: Payer: Self-pay | Admitting: Family Medicine

## 2023-11-29 ENCOUNTER — Ambulatory Visit (INDEPENDENT_AMBULATORY_CARE_PROVIDER_SITE_OTHER): Admitting: Family Medicine

## 2023-11-29 VITALS — BP 135/58 | HR 70 | Ht 61.0 in | Wt 198.1 lb

## 2023-11-29 DIAGNOSIS — I10 Essential (primary) hypertension: Secondary | ICD-10-CM

## 2023-11-29 DIAGNOSIS — E1169 Type 2 diabetes mellitus with other specified complication: Secondary | ICD-10-CM | POA: Diagnosis not present

## 2023-11-29 DIAGNOSIS — E782 Mixed hyperlipidemia: Secondary | ICD-10-CM

## 2023-11-29 DIAGNOSIS — E1165 Type 2 diabetes mellitus with hyperglycemia: Secondary | ICD-10-CM

## 2023-11-29 DIAGNOSIS — J309 Allergic rhinitis, unspecified: Secondary | ICD-10-CM | POA: Insufficient documentation

## 2023-11-29 DIAGNOSIS — Z1159 Encounter for screening for other viral diseases: Secondary | ICD-10-CM

## 2023-11-29 DIAGNOSIS — E785 Hyperlipidemia, unspecified: Secondary | ICD-10-CM

## 2023-11-29 DIAGNOSIS — J302 Other seasonal allergic rhinitis: Secondary | ICD-10-CM | POA: Diagnosis not present

## 2023-11-29 LAB — POCT GLYCOSYLATED HEMOGLOBIN (HGB A1C): Hemoglobin A1C: 5.5 % (ref 4.0–5.6)

## 2023-11-29 MED ORDER — HYDROXYZINE HCL 25 MG PO TABS: 25.0000 mg | ORAL_TABLET | Freq: Every day | ORAL | 1 refills | Status: AC

## 2023-11-29 MED ORDER — TRULICITY 4.5 MG/0.5ML ~~LOC~~ SOAJ: 4.5000 mg | SUBCUTANEOUS | 1 refills | Status: DC

## 2023-11-29 NOTE — Assessment & Plan Note (Signed)
 Type 2 diabetes mellitus with hyperlipidemia Diabetes well-controlled with A1c of 5.5%. Weight loss slow but progressing. Current Dulaglutide  effective for glycemic control, potential increase to 4.5 mg/0.5 mL for weight management pending insurance approval. - Submit prescription for Dulaglutide  4.5 mg/0.5 mL subcutaneous if insurance approves. - Continue dietary recommendations, increase protein intake from natural sources. - Encourage regular physical activity, 30 minutes five days a week.

## 2023-11-29 NOTE — Progress Notes (Signed)
 Established Patient Office Visit  Subjective  Patient ID: Pam Porter, female    DOB: [birth year]  Age: 78 y.o. MRN: [id number]  Chief Complaint  Patient presents with   Diabetes    HPI  Discussed the use of AI scribe software for clinical note transcription with the patient, who gave verbal consent to proceed.  History of Present Illness Pam Porter is a 78 year old female who presents with persistent sinus symptoms.  Chronic sinonasal symptoms - Persistent sinus symptoms for the past three weeks, recurring annually - Symptoms include nasal and throat drainage and pressure in the head - Current management includes Nasonex, increased antihistamine dosage, and saline rinses - Current antihistamine selected due to renal safety, as recommended by her urologist; previously used Zyrtec and Allegra  Diabetes mellitus management - Currently taking Trulicity  for glycemic control - Recent weight is 198 pounds, a decrease of approximately four pounds over three months - Hemoglobin A1c is 5.5  Dietary habits and protein intake - Diet is predominantly vegetables with limited protein intake - Prefers nuts and occasionally cheese with apples as protein sources - Does not prefer protein shakes, aims to obtain protein from natural foods  Gastrointestinal symptoms - Constipation attributed to Trulicity  use - Manages constipation with stool softeners, occasional Miralax, salads, and dietary fiber  Physical activity - Physical activity is currently limited - Plans to increase exercise as weather cools and travel decreases      ROS    Objective:     BP (!) 135/58   Pulse 70   Ht 5' 1 (1.549 m)   Wt 198 lb 1.9 oz (89.9 kg)   SpO2 98%   BMI 37.43 kg/m    Physical Exam Constitutional:      Appearance: Normal appearance.  HENT:     Head: Normocephalic and atraumatic.     Right Ear: Tympanic membrane, ear canal and external ear normal. There is no impacted  cerumen.     Left Ear: Tympanic membrane, ear canal and external ear normal. There is no impacted cerumen.     Nose: Nose normal.     Mouth/Throat:     Pharynx: Oropharynx is clear.  Eyes:     Conjunctiva/sclera: Conjunctivae normal.  Cardiovascular:     Rate and Rhythm: Normal rate and regular rhythm.  Pulmonary:     Effort: Pulmonary effort is normal.     Breath sounds: Normal breath sounds.  Skin:    General: Skin is warm and dry.  Neurological:     Mental Status: She is alert and oriented to person, place, and time.  Psychiatric:        Mood and Affect: Mood normal.      Results for orders placed or performed in visit on 11/29/23  POCT HgB A1C  Result Value Ref Range   Hemoglobin A1C 5.5 4.0 - 5.6 %   HbA1c POC (<> result, manual entry)     HbA1c, POC (prediabetic range)     HbA1c, POC (controlled diabetic range)        The 10-year ASCVD risk score (Arnett DK, et al., 2019) is: 39.2%    Assessment & Plan:   Problem List Items Addressed This Visit       Cardiovascular and Mediastinum   Essential hypertension   Relevant Orders   CMP14+EGFR   Lipid panel     Respiratory   Allergic rhinitis   Relevant Medications   hydrOXYzine  (ATARAX ) 25 MG tablet  Endocrine   Hyperlipidemia associated with type 2 diabetes mellitus (HCC) - Primary   Type 2 diabetes mellitus with hyperlipidemia Diabetes well-controlled with A1c of 5.5%. Weight loss slow but progressing. Current Dulaglutide  effective for glycemic control, potential increase to 4.5 mg/0.5 mL for weight management pending insurance approval. - Submit prescription for Dulaglutide  4.5 mg/0.5 mL subcutaneous if insurance approves. - Continue dietary recommendations, increase protein intake from natural sources. - Encourage regular physical activity, 30 minutes five days a week.      Relevant Medications   Dulaglutide  (TRULICITY ) 4.5 MG/0.5ML SOAJ     Other   Hyperlipidemia   Other Visit Diagnoses        Encounter for hepatitis C screening test for low risk patient       Relevant Orders   Hepatitis C Antibody      Assessment and Plan Assessment & Plan   Constipation Constipation managed with stool softeners and Miralax. Increasing dietary fiber intake beneficial. - Continue stool softeners as needed. - Use Miralax as needed. - Increase dietary fiber intake.  Chronic allergic rhinitis with seasonal exacerbation Chronic allergic rhinitis with sinus congestion and post-nasal drip. Managed with Nasonex, antihistamines, and saline nasal rinses. Effective with renal considerations. - Continue Nasonex and antihistamine regimen. - Consider increasing antihistamine dosage to twice daily if symptoms persist. - Use saline nasal rinses regularly. - Contact provider if symptoms worsen or do not improve.   Return in about 3 months (around 02/29/2024) for Diabetes follow-up.    [name], MD

## 2023-11-30 LAB — CMP14+EGFR
ALT: 24 IU/L (ref 0–32)
AST: 20 IU/L (ref 0–40)
Albumin: 4.1 g/dL (ref 3.8–4.8)
Alkaline Phosphatase: 84 IU/L (ref 49–135)
BUN/Creatinine Ratio: 13 (ref 12–28)
BUN: 11 mg/dL (ref 8–27)
Bilirubin Total: 0.5 mg/dL (ref 0.0–1.2)
CO2: 23 mmol/L (ref 20–29)
Calcium: 9.8 mg/dL (ref 8.7–10.3)
Chloride: 103 mmol/L (ref 96–106)
Creatinine, Ser: 0.88 mg/dL (ref 0.57–1.00)
Globulin, Total: 2.2 g/dL (ref 1.5–4.5)
Glucose: 102 mg/dL — ABNORMAL HIGH (ref 70–99)
Potassium: 4.2 mmol/L (ref 3.5–5.2)
Sodium: 142 mmol/L (ref 134–144)
Total Protein: 6.3 g/dL (ref 6.0–8.5)
eGFR: 68 mL/min/1.73 (ref >59)

## 2023-11-30 LAB — LIPID PANEL
Chol/HDL Ratio: 3.1 ratio (ref 0.0–4.4)
Cholesterol, Total: 185 mg/dL (ref 100–199)
HDL: 59 mg/dL (ref >39)
LDL Chol Calc (NIH): 114 mg/dL — ABNORMAL HIGH (ref 0–99)
Triglycerides: 66 mg/dL (ref 0–149)
VLDL Cholesterol Cal: 12 mg/dL (ref 5–40)

## 2023-11-30 LAB — HEPATITIS C ANTIBODY: Hep C Virus Ab: NONREACTIVE

## 2023-12-01 ENCOUNTER — Ambulatory Visit: Payer: Self-pay | Admitting: Family Medicine

## 2023-12-01 NOTE — Progress Notes (Signed)
 Hi Tiernan, metabolic panel overall looks good including sodium level.  LDL cholesterol jumped up a little bit it was 88 last year which was at goal this year it was 114.  So just continue to work on healthy diet and regular exercise.  Negative for hepatitis C.  We usually just screen for that once in your lifetime.

## 2023-12-03 ENCOUNTER — Other Ambulatory Visit: Payer: Self-pay | Admitting: Family Medicine

## 2023-12-03 DIAGNOSIS — J302 Other seasonal allergic rhinitis: Secondary | ICD-10-CM

## 2023-12-22 DIAGNOSIS — K6389 Other specified diseases of intestine: Secondary | ICD-10-CM | POA: Diagnosis not present

## 2023-12-22 DIAGNOSIS — Z860100 Personal history of colon polyps, unspecified: Secondary | ICD-10-CM | POA: Diagnosis not present

## 2023-12-22 DIAGNOSIS — D128 Benign neoplasm of rectum: Secondary | ICD-10-CM | POA: Diagnosis not present

## 2023-12-22 DIAGNOSIS — Z09 Encounter for follow-up examination after completed treatment for conditions other than malignant neoplasm: Secondary | ICD-10-CM | POA: Diagnosis not present

## 2023-12-22 DIAGNOSIS — D125 Benign neoplasm of sigmoid colon: Secondary | ICD-10-CM | POA: Diagnosis not present

## 2023-12-22 DIAGNOSIS — D126 Benign neoplasm of colon, unspecified: Secondary | ICD-10-CM | POA: Diagnosis not present

## 2023-12-22 DIAGNOSIS — K5730 Diverticulosis of large intestine without perforation or abscess without bleeding: Secondary | ICD-10-CM | POA: Diagnosis not present

## 2023-12-22 LAB — HM COLONOSCOPY

## 2024-03-12 ENCOUNTER — Encounter: Payer: Self-pay | Admitting: Family Medicine

## 2024-03-12 ENCOUNTER — Ambulatory Visit: Admitting: Family Medicine

## 2024-03-12 VITALS — BP 130/55 | HR 72 | Ht 61.0 in | Wt 196.0 lb

## 2024-03-12 DIAGNOSIS — E785 Hyperlipidemia, unspecified: Secondary | ICD-10-CM

## 2024-03-12 DIAGNOSIS — I10 Essential (primary) hypertension: Secondary | ICD-10-CM

## 2024-03-12 DIAGNOSIS — Z7985 Long-term (current) use of injectable non-insulin antidiabetic drugs: Secondary | ICD-10-CM

## 2024-03-12 DIAGNOSIS — E1165 Type 2 diabetes mellitus with hyperglycemia: Secondary | ICD-10-CM

## 2024-03-12 DIAGNOSIS — E782 Mixed hyperlipidemia: Secondary | ICD-10-CM

## 2024-03-12 DIAGNOSIS — E1169 Type 2 diabetes mellitus with other specified complication: Secondary | ICD-10-CM

## 2024-03-12 LAB — POCT GLYCOSYLATED HEMOGLOBIN (HGB A1C): Hemoglobin A1C: 5.5 % (ref 4.0–5.6)

## 2024-03-12 MED ORDER — TRULICITY 3 MG/0.5ML ~~LOC~~ SOAJ: 3.0000 mg | SUBCUTANEOUS | 1 refills | Status: DC

## 2024-03-12 NOTE — Progress Notes (Signed)
 "  Established Patient Office Visit  Patient ID: Pam Porter, female    DOB: Sep 29, 1945  Age: 79 y.o. MRN: [id number] PCP: Alvan Pam BIRCH, MD  Chief Complaint  Patient presents with   Diabetes   Hypertension    Subjective:     HPI  Discussed the use of AI scribe software for clinical note transcription with the patient, who gave verbal consent to proceed.  History of Present Illness Pam Porter is a 79 year old female with type 2 diabetes who presents for medication management and follow-up.  Glycemic management and weight changes - Type 2 diabetes managed with Trulicity . - Gradual weight loss over the past year from 210 lbs to 196 lbs. - Current diet is high in fiber, primarily vegetables, lean protein, and low in carbohydrates. - Limited meat intake with daily cheese consumption; prefers red beans over lentils. - Considering addition of low-carbohydrate protein shakes such as Premier Protein.  Gastrointestinal symptoms - Constipation, particularly at the beginning of the week. - Takes two stool softeners daily. - Occasional use of Miralax for relief.  Physical activity - Engages in regular walking as primary form of exercise. - Considering addition of resistance training.  Ophthalmologic status - Recent eye exam in December with vision at 20/20 and 20/30.  Dermatologic history - Recent dermatology procedure for a scaling patch treated with cryotherapy.  Medication management - Current medications include Trulicity  and stool softeners. - Manages prescriptions through [facility] pharmacy. - Recent change in Part D insurance plan.     ROS    Objective:     BP (!) 130/55   Pulse 72   Ht 5' 1 (1.549 m)   Wt 196 lb (88.9 kg)   SpO2 97%   BMI 37.03 kg/m    Physical Exam Vitals and nursing note reviewed.  Constitutional:      Appearance: Normal appearance.  HENT:     Head: Normocephalic and atraumatic.  Eyes:      Conjunctiva/sclera: Conjunctivae normal.  Cardiovascular:     Rate and Rhythm: Normal rate and regular rhythm.  Pulmonary:     Effort: Pulmonary effort is normal.     Breath sounds: Normal breath sounds.  Skin:    General: Skin is warm and dry.  Neurological:     Mental Status: She is alert.  Psychiatric:        Mood and Affect: Mood normal.      Results for orders placed or performed in visit on 03/12/24  POCT HgB A1C  Result Value Ref Range   Hemoglobin A1C 5.5 4.0 - 5.6 %   HbA1c POC (<> result, manual entry)     HbA1c, POC (prediabetic range)     HbA1c, POC (controlled diabetic range)        The 10-year ASCVD risk score (Arnett DK, et al., 2019) is: 41.1%    Assessment & Plan:   Problem List Items Addressed This Visit       Cardiovascular and Mediastinum   Essential hypertension   Well controlled on Norvasc  and losartan .          Endocrine   Hyperlipidemia associated with type 2 diabetes mellitus (HCC)   Type 2 Diabetes Mellitus Well-managed with Trulicity  4.5mg  . Considering dosage reduction to alleviate constipation while maintaining glycemic control. Emphasized high-fiber diet, regular exercise, and adequate protein intake. - Prescribe Trulicity  3 mg via [facility] mail order pharmacy. - Obtain prior authorization for Trulicity  with new insurance. - Encourage high-fiber, low-carb diet  and regular walking. - Recommend resistance training twice weekly. - Advise occasional use of Miralax for constipation. - Discuss protein intake options, including Premier protein shakes and plant-based proteins.      Relevant Medications   Dulaglutide  (TRULICITY ) 3 MG/0.5ML SOAJ     Other   Severe obesity (BMI 35.0-39.9) with comorbidity (HCC)   Continues to lose weight.  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 Advised health precautions during flu season. - Confirm receipt of December eye exam report. - Advise using hand sanitizer and avoiding contact with sick individuals during flu season.    Return in about 4 months (around 07/10/2024) for Diabetes follow-up.    Pam Byars, MD [facility] at [facility]   "

## 2024-03-12 NOTE — Assessment & Plan Note (Signed)
 Type 2 Diabetes Mellitus Well-managed with Trulicity  4.5mg  . Considering dosage reduction to alleviate constipation while maintaining glycemic control. Emphasized high-fiber diet, regular exercise, and adequate protein intake. - Prescribe Trulicity  3 mg via [facility] mail order pharmacy. - Obtain prior authorization for Trulicity  with new insurance. - Encourage high-fiber, low-carb diet and regular walking. - Recommend resistance training twice weekly. - Advise occasional use of Miralax for constipation. - Discuss protein intake options, including Premier protein shakes and plant-based proteins.

## 2024-03-12 NOTE — Assessment & Plan Note (Signed)
 Continues to lose weight.  Work on [employer] and adding in emergency planning/management officer

## 2024-03-12 NOTE — Assessment & Plan Note (Addendum)
Well controlled on Norvasc and losartan

## 2024-03-18 ENCOUNTER — Encounter: Payer: Self-pay | Admitting: Family Medicine

## 2024-03-18 DIAGNOSIS — E1165 Type 2 diabetes mellitus with hyperglycemia: Secondary | ICD-10-CM

## 2024-03-19 MED ORDER — TRULICITY 3 MG/0.5ML ~~LOC~~ SOAJ: 3.0000 mg | SUBCUTANEOUS | 1 refills | Status: AC

## 2024-07-10 ENCOUNTER — Ambulatory Visit: Admitting: Family Medicine

## 2024-11-21 ENCOUNTER — Ambulatory Visit
